# Patient Record
Sex: Female | Born: 1955
Health system: Southern US, Community
[De-identification: ages and names within clinical notes are randomized; demographics above are authoritative.]

## PROBLEM LIST (undated history)

## (undated) HISTORY — PX: LAPAROSCOPY: SHX197

## (undated) HISTORY — PX: BARTHOLIN GLAND CYST EXCISION: SHX565

## (undated) HISTORY — PX: DILATION AND CURETTAGE OF UTERUS: SHX78

---

## 2003-12-21 ENCOUNTER — Ambulatory Visit: Payer: Self-pay | Admitting: Unknown Physician Specialty

## 2004-08-07 ENCOUNTER — Ambulatory Visit: Payer: Self-pay | Admitting: Unknown Physician Specialty

## 2007-04-17 ENCOUNTER — Ambulatory Visit: Payer: Self-pay | Admitting: Obstetrics and Gynecology

## 2011-02-16 ENCOUNTER — Ambulatory Visit: Payer: Self-pay | Admitting: Family Medicine

## 2012-07-04 ENCOUNTER — Ambulatory Visit: Payer: Self-pay | Admitting: Family Medicine

## 2012-09-19 LAB — TSH: TSH: 2.98 u[IU]/mL (ref 0.41–5.90)

## 2012-09-19 LAB — LIPID PANEL
Cholesterol: 198 mg/dL (ref 0–200)
HDL: 79 mg/dL — AB (ref 35–70)
LDL CALC: 103 mg/dL
LDl/HDL Ratio: 1.3
Triglycerides: 140 mg/dL (ref 40–160)

## 2012-09-19 LAB — BASIC METABOLIC PANEL
BUN: 15 mg/dL (ref 4–21)
CREATININE: 0.9 mg/dL (ref 0.5–1.1)
Glucose: 88 mg/dL
Potassium: 4.5 mmol/L (ref 3.4–5.3)
Sodium: 140 mmol/L (ref 137–147)

## 2012-09-19 LAB — CBC AND DIFFERENTIAL
HCT: 43 % (ref 36–46)
Hemoglobin: 14.9 g/dL (ref 12.0–16.0)
Neutrophils Absolute: 2 /uL
PLATELETS: 256 10*3/uL (ref 150–399)
WBC: 6.2 10*3/mL

## 2012-09-19 LAB — HEPATIC FUNCTION PANEL
ALK PHOS: 79 U/L (ref 25–125)
ALT: 18 U/L (ref 7–35)
AST: 20 U/L (ref 13–35)

## 2014-09-14 DIAGNOSIS — D259 Leiomyoma of uterus, unspecified: Secondary | ICD-10-CM | POA: Insufficient documentation

## 2014-09-14 DIAGNOSIS — N809 Endometriosis, unspecified: Secondary | ICD-10-CM | POA: Insufficient documentation

## 2014-09-14 DIAGNOSIS — I10 Essential (primary) hypertension: Secondary | ICD-10-CM | POA: Insufficient documentation

## 2014-09-14 DIAGNOSIS — E039 Hypothyroidism, unspecified: Secondary | ICD-10-CM | POA: Insufficient documentation

## 2014-09-14 DIAGNOSIS — G43909 Migraine, unspecified, not intractable, without status migrainosus: Secondary | ICD-10-CM | POA: Insufficient documentation

## 2014-09-14 DIAGNOSIS — I459 Conduction disorder, unspecified: Secondary | ICD-10-CM | POA: Insufficient documentation

## 2014-09-14 DIAGNOSIS — R011 Cardiac murmur, unspecified: Secondary | ICD-10-CM | POA: Insufficient documentation

## 2014-09-15 ENCOUNTER — Ambulatory Visit (INDEPENDENT_AMBULATORY_CARE_PROVIDER_SITE_OTHER): Payer: BLUE CROSS/BLUE SHIELD | Admitting: Family Medicine

## 2014-09-15 ENCOUNTER — Encounter: Payer: Self-pay | Admitting: Family Medicine

## 2014-09-15 DIAGNOSIS — Z0000 Encounter for general adult medical examination without abnormal findings: Secondary | ICD-10-CM | POA: Diagnosis not present

## 2014-09-15 DIAGNOSIS — R002 Palpitations: Secondary | ICD-10-CM | POA: Diagnosis not present

## 2014-09-15 DIAGNOSIS — E038 Other specified hypothyroidism: Secondary | ICD-10-CM

## 2014-09-15 DIAGNOSIS — Z1211 Encounter for screening for malignant neoplasm of colon: Secondary | ICD-10-CM

## 2014-09-15 MED ORDER — SYNTHROID 50 MCG PO TABS: 50.0000 ug | ORAL_TABLET | Freq: Every day | ORAL | Status: DC

## 2014-09-15 NOTE — Progress Notes (Signed)
Patient ID: Marissa Mccarthy, female   DOB: May 11, 1955, 59 y.o.   MRN: [id number] Visit Date: 09/15/2014  Today's Provider: Wilhemena Durie, MD   Chief Complaint  Patient presents with  . Annual Exam   Subjective:  Marissa Mccarthy is a 59 y.o. female who presents today for health maintenance and complete physical. She feels fairly well. She reports exercising x 2 times a week-walking and lifting weights. She reports she is sleeping fairly well-sometimes, stress with her son right now but states he will moving out soon.  LAST: EKG-09/17/12  Colonoscopy-08/07/04 internal hemorrhoids-repeat 5 years.  Routine labs-09/17/12  Pap smear- 10/2013 in [place] and she has appt scheduled for this  Mammogram-10/2013-she has appointment scheduled for this  Review of Systems  Constitutional: Negative.   HENT: Positive for sneezing.   Eyes: Negative.   Respiratory: Negative.   Cardiovascular: Positive for palpitations. Negative for chest pain and leg swelling.  Gastrointestinal: Negative.   Endocrine: Positive for heat intolerance.  Genitourinary: Negative.   Musculoskeletal: Negative.   Skin: Negative.   Allergic/Immunologic: Positive for environmental allergies.  Neurological: Negative.   Hematological: Negative.   Psychiatric/Behavioral: Positive for sleep disturbance.    Social History   Social History  . Marital Status: Married    Spouse Name: N/A  . Number of Children: N/A  . Years of Education: N/A   Occupational History  . Not on file.   Social History Main Topics  . Smoking status: Never Smoker   . Smokeless tobacco: Never Used  . Alcohol Use: Yes     Comment: 1 drink daily  . Drug Use: No  . Sexual Activity: Not on file   Other Topics Concern  . Not on file   Social History Narrative    Patient Active Problem List   Diagnosis Date Noted  . Cardiac conduction disorder 09/14/2014  . Endometriosis 09/14/2014  . Essential (primary) hypertension 09/14/2014   . Cardiac murmur 09/14/2014  . Adult hypothyroidism 09/14/2014  . Headache, migraine 09/14/2014  . Leiomyoma of uterus 09/14/2014    Past Surgical History  Procedure Laterality Date  . Bartholin gland cyst excision    . Dilation and curettage of uterus    . Laparoscopy      re infertility    Her family history includes Cancer in her mother; Deep vein thrombosis in her sister; Heart attack in her father; Hyperlipidemia in her mother; Hypertension in her father and sister; Irritable bowel syndrome in her mother; Osteoporosis in her mother.    Outpatient Prescriptions Prior to Visit  Medication Sig Dispense Refill  . levothyroxine (SYNTHROID) 50 MCG tablet Take by mouth.     No facility-administered medications prior to visit.    Patient Care Team: Jerrol Banana., MD as PCP - General (Family Medicine)     Objective:   Vitals: There were no vitals filed for this visit.  Physical Exam  Constitutional: She is oriented to person, place, and time. She appears well-developed and well-nourished.  HENT:  Head: Normocephalic and atraumatic.  Right Ear: External ear normal.  Left Ear: External ear normal.  Nose: Nose normal.  Mouth/Throat: Oropharynx is clear and moist.  Eyes: Conjunctivae are normal.  Neck: Neck supple.  Cardiovascular: Normal rate, regular rhythm, normal heart sounds and intact distal pulses.   Pulmonary/Chest: Effort normal and breath sounds normal.  Abdominal: Soft. Bowel sounds are normal.  Neurological: She is alert and oriented to person, place, and time.  Skin: Skin is warm and dry.  Psychiatric: She has a normal mood and affect. Her behavior is normal. Judgment and thought content normal.     Depression Screen PHQ 2/9 Scores 09/15/2014  PHQ - 2 Score 0      Assessment & Plan:    1. Palpitation  - EKG 12-Lead - Ambulatory referral to Cardiology  2. Annual physical exam  - CBC with Differential/Platelet - Lipid Panel With LDL/HDL  Ratio - Comprehensive Metabolic Panel (CMET)  3. Other specified hypothyroidism  - TSH - SYNTHROID 50 MCG tablet; Take 1 tablet (50 mcg total) by mouth daily.  Dispense: 30 tablet; Refill: 12  4. Colon cancer screening  - Ambulatory referral to Gastroenterology  I have done the exam and reviewed the above chart and it is accurate to the best of my knowledge.

## 2014-09-21 NOTE — Addendum Note (Signed)
Addended by: Miguel Aschoff on: 09/21/2014 07:24 PM   Modules accepted: Miquel Dunn

## 2014-09-24 LAB — CBC WITH DIFFERENTIAL/PLATELET
BASOS: 1 %
Basophils Absolute: 0.1 10*3/uL (ref 0.0–0.2)
EOS (ABSOLUTE): 0.3 10*3/uL (ref 0.0–0.4)
EOS: 4 %
HEMATOCRIT: 42 % (ref 34.0–46.6)
Hemoglobin: 14 g/dL (ref 11.1–15.9)
Immature Grans (Abs): 0 10*3/uL (ref 0.0–0.1)
Immature Granulocytes: 0 %
LYMPHS ABS: 3.2 10*3/uL — AB (ref 0.7–3.1)
Lymphs: 48 %
MCH: 29.9 pg (ref 26.6–33.0)
MCHC: 33.3 g/dL (ref 31.5–35.7)
MCV: 90 fL (ref 79–97)
MONOS ABS: 0.5 10*3/uL (ref 0.1–0.9)
Monocytes: 8 %
Neutrophils Absolute: 2.5 10*3/uL (ref 1.4–7.0)
Neutrophils: 39 %
Platelets: 258 10*3/uL (ref 150–379)
RBC: 4.69 x10E6/uL (ref 3.77–5.28)
RDW: 13.8 % (ref 12.3–15.4)
WBC: 6.6 10*3/uL (ref 3.4–10.8)

## 2014-09-24 LAB — COMPREHENSIVE METABOLIC PANEL
A/G RATIO: 1.8 (ref 1.1–2.5)
ALK PHOS: 78 IU/L (ref 39–117)
ALT: 25 IU/L (ref 0–32)
AST: 26 IU/L (ref 0–40)
Albumin: 4.6 g/dL (ref 3.5–5.5)
BUN/Creatinine Ratio: 15 (ref 9–23)
BUN: 13 mg/dL (ref 6–24)
Bilirubin Total: 0.6 mg/dL (ref 0.0–1.2)
CO2: 21 mmol/L (ref 18–29)
Calcium: 10.1 mg/dL (ref 8.7–10.2)
Chloride: 104 mmol/L (ref 97–108)
Creatinine, Ser: 0.84 mg/dL (ref 0.57–1.00)
GFR calc Af Amer: 89 mL/min/{1.73_m2} (ref >59)
GFR calc non Af Amer: 77 mL/min/{1.73_m2} (ref >59)
GLOBULIN, TOTAL: 2.6 g/dL (ref 1.5–4.5)
Glucose: 85 mg/dL (ref 65–99)
POTASSIUM: 4.3 mmol/L (ref 3.5–5.2)
SODIUM: 143 mmol/L (ref 134–144)
Total Protein: 7.2 g/dL (ref 6.0–8.5)

## 2014-09-24 LAB — LIPID PANEL WITH LDL/HDL RATIO
Cholesterol, Total: 205 mg/dL — ABNORMAL HIGH (ref 100–199)
HDL: 71 mg/dL (ref >39)
LDL Calculated: 111 mg/dL — ABNORMAL HIGH (ref 0–99)
LDL/HDL RATIO: 1.6 ratio (ref 0.0–3.2)
Triglycerides: 117 mg/dL (ref 0–149)
VLDL Cholesterol Cal: 23 mg/dL (ref 5–40)

## 2014-09-24 LAB — TSH: TSH: 5.47 u[IU]/mL — AB (ref 0.450–4.500)

## 2014-09-24 MED ORDER — SYNTHROID 75 MCG PO TABS: 75.0000 ug | ORAL_TABLET | Freq: Every day | ORAL | Status: DC

## 2014-09-24 NOTE — Addendum Note (Signed)
Addended by: Arnette Norris on: 09/24/2014 11:43 AM   Modules accepted: Orders, Medications

## 2014-10-02 ENCOUNTER — Other Ambulatory Visit: Payer: Self-pay | Admitting: Family Medicine

## 2014-11-08 ENCOUNTER — Other Ambulatory Visit: Payer: Self-pay | Admitting: Nurse Practitioner

## 2014-11-08 DIAGNOSIS — R1031 Right lower quadrant pain: Secondary | ICD-10-CM

## 2014-11-16 ENCOUNTER — Ambulatory Visit
Admission: RE | Admit: 2014-11-16 | Discharge: 2014-11-16 | Disposition: A | Discharge status: Home / Self Care | Payer: BLUE CROSS/BLUE SHIELD | Source: Ambulatory Visit | Attending: Nurse Practitioner | Admitting: Nurse Practitioner

## 2014-11-16 DIAGNOSIS — R1031 Right lower quadrant pain: Secondary | ICD-10-CM | POA: Insufficient documentation

## 2014-11-16 MED ORDER — IOHEXOL 300 MG/ML  SOLN
100.0000 mL | Freq: Once | INTRAMUSCULAR | Status: AC | PRN
  Administered 2014-11-16: 100 mL via INTRAVENOUS

## 2015-01-03 ENCOUNTER — Encounter: Payer: Self-pay | Admitting: Family Medicine

## 2015-05-25 ENCOUNTER — Other Ambulatory Visit: Payer: Self-pay | Admitting: Obstetrics and Gynecology

## 2015-05-25 DIAGNOSIS — N8500 Endometrial hyperplasia, unspecified: Secondary | ICD-10-CM

## 2015-05-26 ENCOUNTER — Ambulatory Visit (INDEPENDENT_AMBULATORY_CARE_PROVIDER_SITE_OTHER): Payer: BLUE CROSS/BLUE SHIELD

## 2015-05-26 DIAGNOSIS — N8500 Endometrial hyperplasia, unspecified: Secondary | ICD-10-CM | POA: Diagnosis not present

## 2015-10-25 ENCOUNTER — Other Ambulatory Visit: Payer: Self-pay | Admitting: Family Medicine

## 2016-03-10 IMAGING — CT CT ABD-PELV W/ CM
2 of 5 series · 16 of 46 positions shown, 18 images · IV contrast (omnipaque)
Comparison: Report from 12/21/2003 CT abdomen/ pelvis (images not
available).

CLINICAL DATA: Right lower quadrant abdominal pain for several
months. Endometriosis.

EXAM:
CT ABDOMEN AND PELVIS WITH CONTRAST
TECHNIQUE: Multidetector CT imaging of the abdomen and pelvis was performed
using the standard protocol following bolus administration of
intravenous contrast.
CONTRAST:  100mL OMNIPAQUE IOHEXOL 300 MG/ML  SOLN

[Series 2: routine with · axial · 0.65mm/px · z∈[-1180,-810]mm · 13 of 84 slices shown, 15 images]
[im 5/84  soft-tissue]
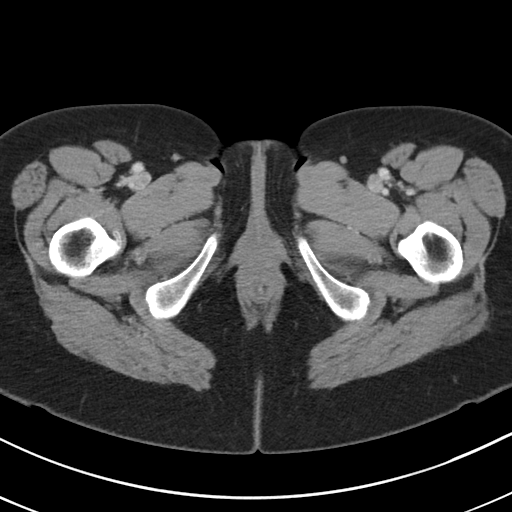
[im 5/84  bone]
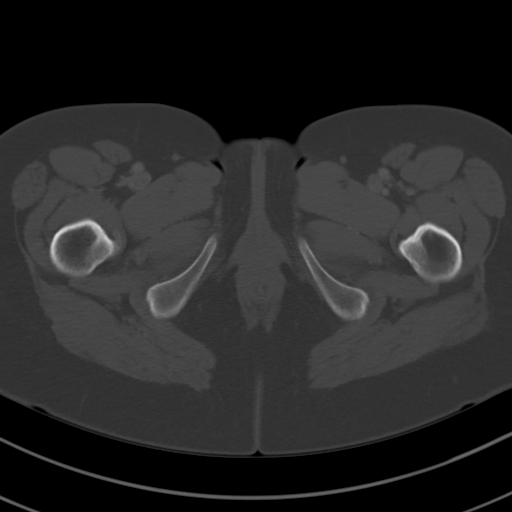
[im 10/84  soft-tissue]
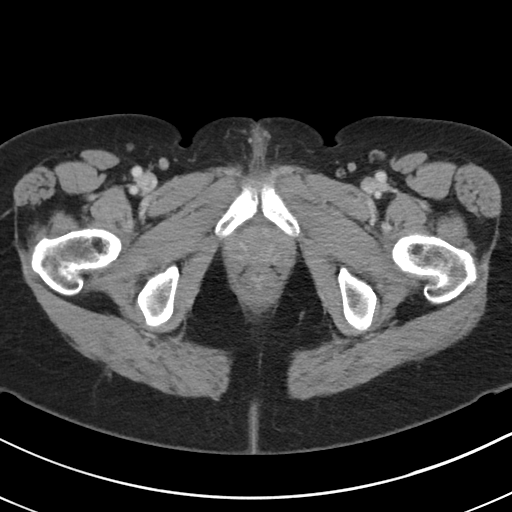
[im 19/84  soft-tissue]
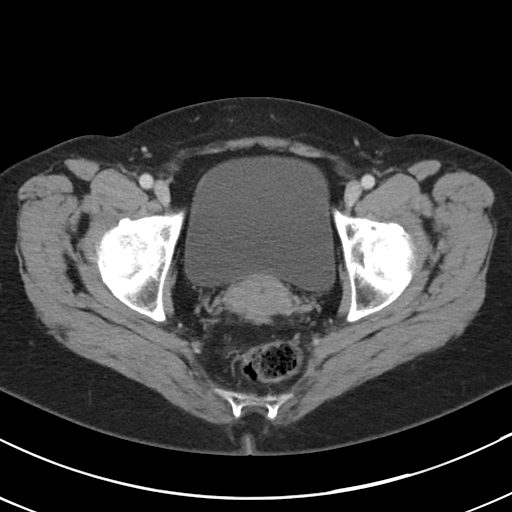
[im 24/84  soft-tissue]
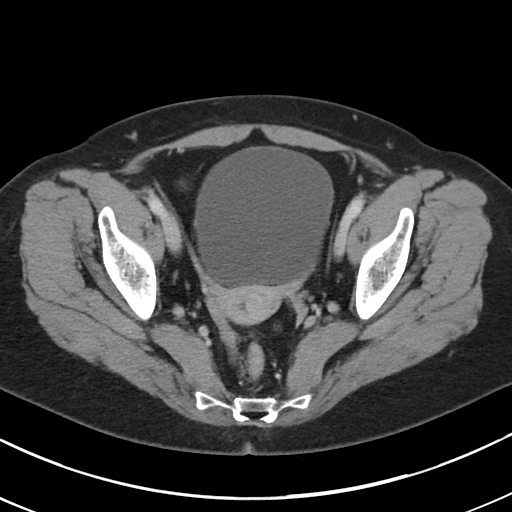
[im 28/84  soft-tissue]
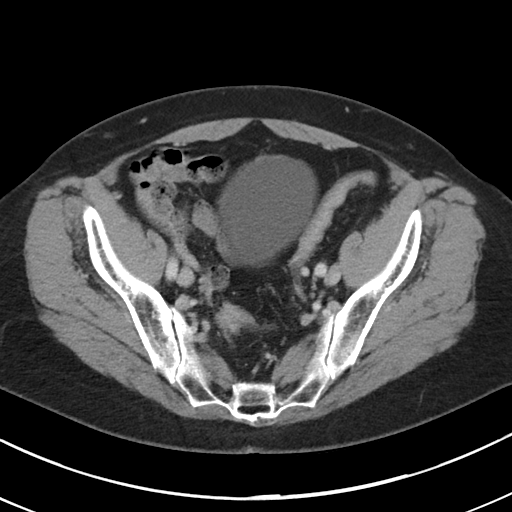
[im 37/84  soft-tissue]
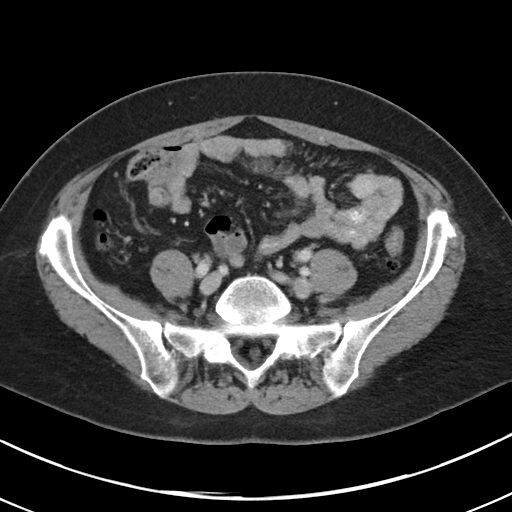
[im 42/84  soft-tissue]
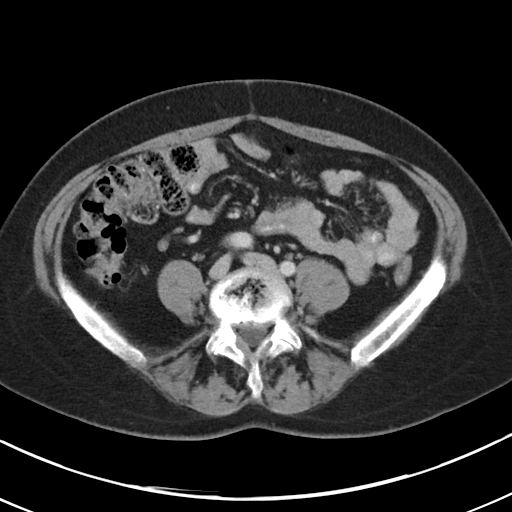
[im 47/84  soft-tissue]
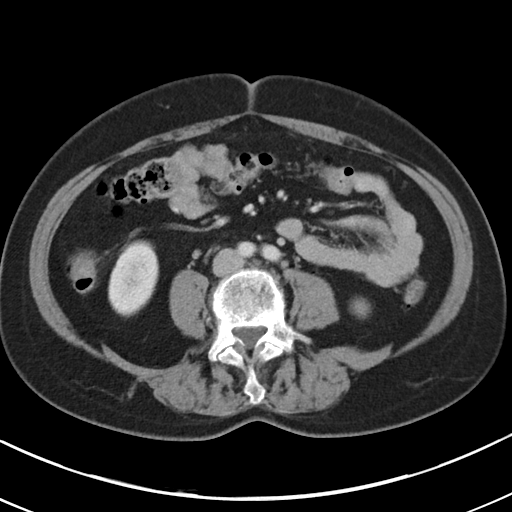
[im 56/84  soft-tissue]
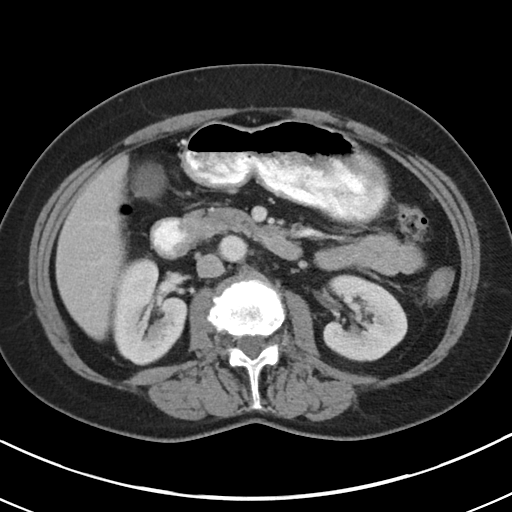
[im 56/84  bone]
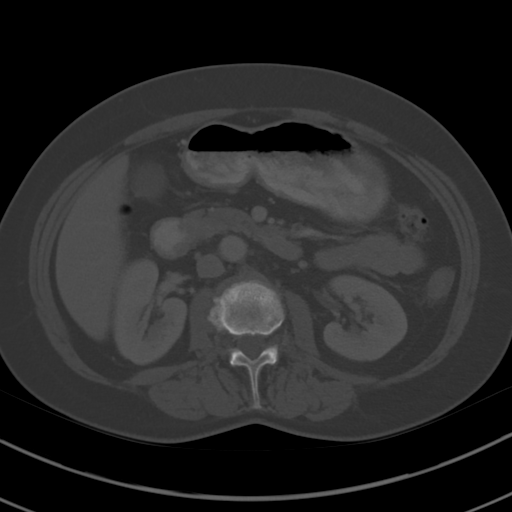
[im 60/84  soft-tissue]
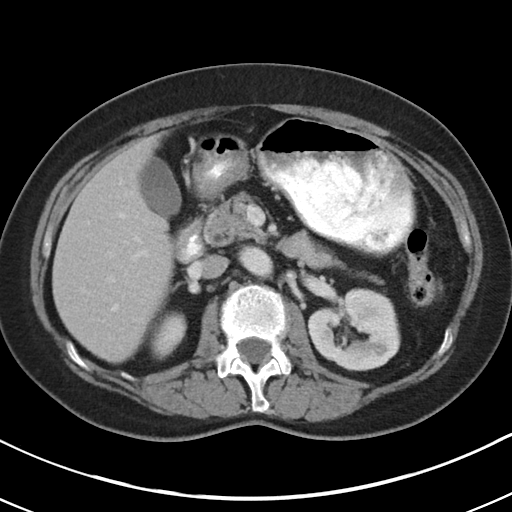
[im 65/84  soft-tissue]
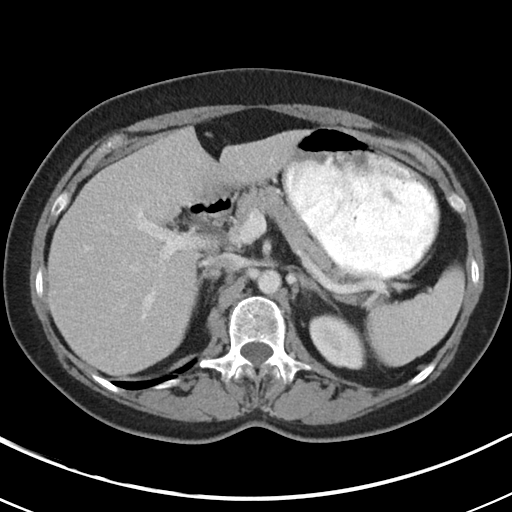
[im 74/84  soft-tissue]
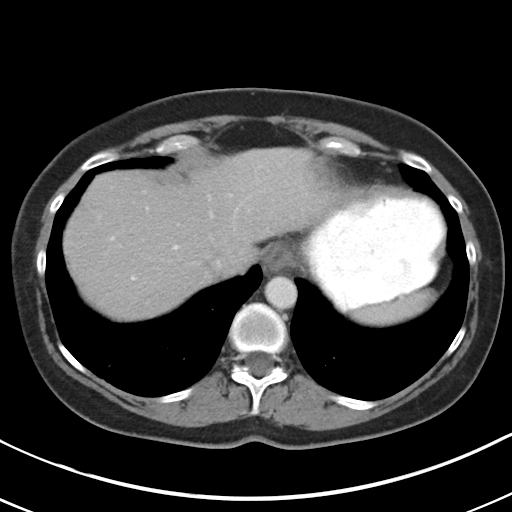
[im 79/84  soft-tissue]
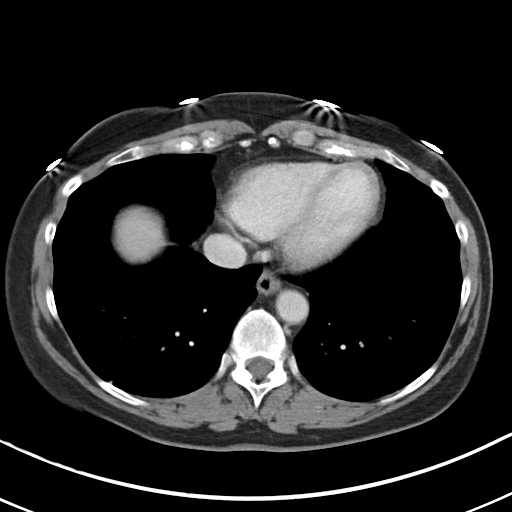

[Series 6: cor routine with · coronal · 0.64mm/px · 3 of 133 slices shown]
[im 45/133  soft-tissue]
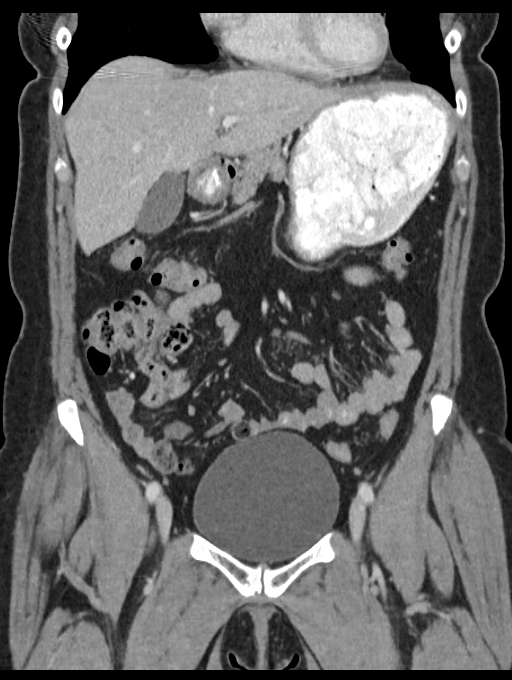
[im 59/133  soft-tissue]
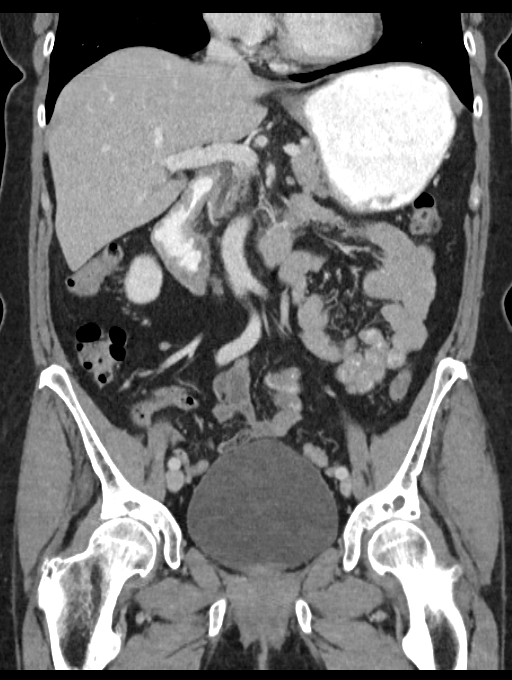
[im 74/133  soft-tissue]
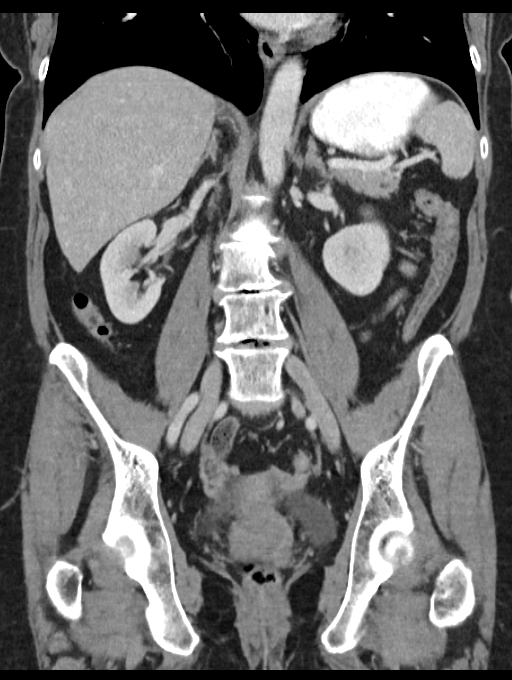

[16 of 46 positions shown; findings below may reference images not displayed]

FINDINGS: Lower chest: Minimal subsegmental scarring in the basilar posterior
right lower lobe.

Hepatobiliary: Normal liver with no liver mass. Normal gallbladder
with no radiopaque cholelithiasis. No biliary ductal dilatation.

Pancreas: Normal, with no mass or duct dilation.

Spleen: Normal size. No mass.

Adrenals/Urinary Tract: Normal adrenals. No hydronephrosis.
Subcentimeter hypodense lesion in the posterior mid to upper left
kidney, too small to characterize. No additional renal lesions.
Normal bladder.

Stomach/Bowel: Grossly normal stomach. Normal caliber small bowel
with no small bowel wall thickening. Normal appendix. Normal large
bowel with no diverticulosis, large bowel wall thickening or
pericolonic fat stranding.

Vascular/Lymphatic: Atherosclerotic nonaneurysmal abdominal aorta.
Patent portal, splenic, hepatic and renal veins. No pathologically
enlarged lymph nodes in the abdomen or pelvis.

Reproductive: There is a 1.1 cm round mass in the central uterus
(series 2/ image 61). No adnexal mass.

Other: No pneumoperitoneum, ascites or focal fluid collection.

Musculoskeletal: No aggressive appearing focal osseous lesions.
Moderate to severe degenerative disc disease in the lumbar spine.
IMPRESSION: 1. No evidence of bowel obstruction or acute bowel inflammation.
Normal appendix.
2. Round 1.1 cm mass in the central uterus, which is nonspecific and
could represent a submucosal/intracavitary fibroid or endometrial
mass. Recommend correlation with transabdominal and transvaginal
pelvic sonography.

## 2016-03-20 ENCOUNTER — Ambulatory Visit: Payer: Self-pay | Admitting: Family Medicine

## 2016-04-03 ENCOUNTER — Ambulatory Visit: Payer: Self-pay | Admitting: Family Medicine

## 2016-04-11 ENCOUNTER — Ambulatory Visit (INDEPENDENT_AMBULATORY_CARE_PROVIDER_SITE_OTHER): Payer: BLUE CROSS/BLUE SHIELD | Admitting: Family Medicine

## 2016-04-11 ENCOUNTER — Encounter: Payer: Self-pay | Admitting: Family Medicine

## 2016-04-11 VITALS — BP 132/78 | HR 72 | Resp 14 | Ht 65.0 in | Wt 168.0 lb

## 2016-04-11 DIAGNOSIS — E784 Other hyperlipidemia: Secondary | ICD-10-CM

## 2016-04-11 DIAGNOSIS — Z6827 Body mass index (BMI) 27.0-27.9, adult: Secondary | ICD-10-CM

## 2016-04-11 DIAGNOSIS — E038 Other specified hypothyroidism: Secondary | ICD-10-CM

## 2016-04-11 DIAGNOSIS — E7849 Other hyperlipidemia: Secondary | ICD-10-CM

## 2016-04-11 NOTE — Progress Notes (Signed)
Marissa Mccarthy  MRN: [id number] DOB: 1955-04-18  Subjective:  HPI  Patient is here for follow up. LOV was 09/15/14 for CPE. Routine labs were done at that time. She is taking Synthroid-brand name. She is not taking it on empty stomach and how to go about when the best time to take it. Lab Results  Component Value Date   TSH 5.470 (H) 09/23/2014    Patient Active Problem List   Diagnosis Date Noted  . Cardiac conduction disorder 09/14/2014  . Endometriosis 09/14/2014  . Essential (primary) hypertension 09/14/2014  . Cardiac murmur 09/14/2014  . Adult hypothyroidism 09/14/2014  . Headache, migraine 09/14/2014  . Leiomyoma of uterus 09/14/2014    History reviewed. No pertinent past medical history.  Social History   Social History  . Marital status: Married    Spouse name: N/A  . Number of children: N/A  . Years of education: N/A   Occupational History  . Not on file.   Social History Main Topics  . Smoking status: Never Smoker  . Smokeless tobacco: Never Used  . Alcohol use Yes     Comment: 1 drink daily  . Drug use: No  . Sexual activity: Not on file   Other Topics Concern  . Not on file   Social History Narrative  . No narrative on file    Outpatient Encounter Prescriptions as of 04/11/2016  Medication Sig  . SYNTHROID 75 MCG tablet TAKE 1 TABLET (75 MCG TOTAL) BY MOUTH DAILY BEFORE BREAKFAST.   No facility-administered encounter medications on file as of 04/11/2016.     No Known Allergies  Review of Systems  Constitutional: Negative.   Respiratory: Negative.   Cardiovascular: Negative.   Gastrointestinal: Negative.   Musculoskeletal: Negative.     Objective:  BP 132/78   Pulse 72   Resp 14   Ht 5\' 5"  (1.651 m)   Wt 168 lb (76.2 kg)   BMI 27.96 kg/m   Physical Exam  Constitutional: She is oriented to person, place, and time and well-developed, well-nourished, and in no distress.  HENT:  Head: Normocephalic and atraumatic.  Eyes:  Conjunctivae are normal. Pupils are equal, round, and reactive to light.  Neck: Normal range of motion. Neck supple.  Cardiovascular: Normal rate, regular rhythm, normal heart sounds and intact distal pulses.   No murmur heard. Pulmonary/Chest: Effort normal and breath sounds normal. No respiratory distress. She has no wheezes.  Abdominal: Soft. She exhibits no distension. There is no tenderness. There is no rebound.  Musculoskeletal: She exhibits no edema or tenderness.  Neurological: She is alert and oriented to person, place, and time.  Psychiatric: Mood, memory, affect and judgment normal.   Assessment and Plan :  1. Other specified hypothyroidism Labs ordered today. Discussed with patient how to take Synthroid but due to her daily routine in her life advised patient to check with pharmacist what is the best way to take the medication for her routine. - CBC w/Diff/Platelet - TSH Mammogram is scheduled in April 2018 and advised patient to get records to [place] on this.  2. BMI 27.0-27.9,adult - CBC w/Diff/Platelet - Comprehensive metabolic panel  3. Other hyperlipidemia - CBC w/Diff/Platelet - Comprehensive metabolic panel - Lipid Panel With LDL/HDL Ratio 4.Health Maintenance Pt UTD on Gyn exam issues. Discussed medical CPE in next year. HPI, Exam and A&P transcribed under direction and in the presence of Miguel Aschoff, MD. I have done the exam and reviewed the chart and it is accurate to  the best of my knowledge. Development worker, community has been used and  any errors in dictation or transcription are unintentional. Miguel Aschoff M.D. [facility]

## 2016-05-04 LAB — HM PAP SMEAR

## 2016-05-07 LAB — HM MAMMOGRAPHY

## 2016-12-10 ENCOUNTER — Other Ambulatory Visit: Payer: Self-pay | Admitting: Family Medicine

## 2017-04-22 ENCOUNTER — Telehealth: Payer: Self-pay | Admitting: Family Medicine

## 2017-04-22 DIAGNOSIS — S62109S Fracture of unspecified carpal bone, unspecified wrist, sequela: Secondary | ICD-10-CM

## 2017-04-22 NOTE — Telephone Encounter (Signed)
thx

## 2017-04-22 NOTE — Telephone Encounter (Signed)
Pt called wanting a referral to [facility] for a broken wrist.    Dr Levell July number is [phone]  She thinks Dr. Enzo Bi talked to you regarding this today.  Pt's call back is [phone]  Thanks Con Memos

## 2017-04-22 NOTE — Telephone Encounter (Signed)
Please advise. Thanks.  

## 2017-04-22 NOTE — Telephone Encounter (Signed)
Per Dr. Rosanna Randy. Referral sent.

## 2017-05-09 ENCOUNTER — Other Ambulatory Visit: Payer: Self-pay | Admitting: Family Medicine

## 2017-05-09 DIAGNOSIS — G8929 Other chronic pain: Secondary | ICD-10-CM

## 2017-05-09 DIAGNOSIS — M545 Low back pain: Principal | ICD-10-CM

## 2017-06-10 ENCOUNTER — Encounter: Payer: Self-pay | Admitting: Family Medicine

## 2017-06-10 ENCOUNTER — Ambulatory Visit (INDEPENDENT_AMBULATORY_CARE_PROVIDER_SITE_OTHER): Payer: BLUE CROSS/BLUE SHIELD | Admitting: Family Medicine

## 2017-06-10 VITALS — BP 132/84 | HR 68 | Temp 98.8°F | Resp 14 | Wt 164.0 lb

## 2017-06-10 DIAGNOSIS — M1611 Unilateral primary osteoarthritis, right hip: Secondary | ICD-10-CM

## 2017-06-10 DIAGNOSIS — G43109 Migraine with aura, not intractable, without status migrainosus: Secondary | ICD-10-CM | POA: Diagnosis not present

## 2017-06-10 DIAGNOSIS — I10 Essential (primary) hypertension: Secondary | ICD-10-CM | POA: Diagnosis not present

## 2017-06-10 DIAGNOSIS — S63501S Unspecified sprain of right wrist, sequela: Secondary | ICD-10-CM

## 2017-06-10 DIAGNOSIS — E039 Hypothyroidism, unspecified: Secondary | ICD-10-CM

## 2017-06-10 DIAGNOSIS — E7849 Other hyperlipidemia: Secondary | ICD-10-CM

## 2017-06-10 DIAGNOSIS — F4323 Adjustment disorder with mixed anxiety and depressed mood: Secondary | ICD-10-CM

## 2017-06-10 MED ORDER — TELMISARTAN 20 MG PO TABS: 20.0000 mg | ORAL_TABLET | Freq: Every day | ORAL | 3 refills | Status: DC

## 2017-06-10 NOTE — Patient Instructions (Signed)
Counselor Lady Deutscher

## 2017-06-10 NOTE — Progress Notes (Signed)
Patient: Marissa Mccarthy Female    DOB: August 14, 1955   62 y.o.   MRN: [id number] Visit Date: 06/10/2017  Today's Provider: Wilhemena Durie, MD   Chief Complaint  Patient presents with  . Consult    discuss BP  . Anxiety   Subjective:    HPI Pt is here today to discuss her BP and her anxiety. She reports that she had an optic migraine last week after a job interview and she has been under a lot more stress than normal. She reports that she used to be on something for her BP but with exercise she was able to get off of it but since her fall she has not been able to exercise and she feels like her BP has been elevated. She also thinks she may need to get back into counseling.  She is under a lot of stress and is worried about TIA/CVA risk. She and her husband are looking to move to the [place] area of [place].She is tearful and feels overwhelmed.   GAD 7 : Generalized Anxiety Score 06/10/2017  Nervous, Anxious, on Edge 2  Control/stop worrying 1  Worry too much - different things 2  Trouble relaxing 1  Restless 0  Easily annoyed or irritable 1  Afraid - awful might happen 0  Total GAD 7 Score 7  Anxiety Difficulty Somewhat difficult       No Known Allergies   Current Outpatient Medications:  .  SYNTHROID 75 MCG tablet, TAKE 1 TABLET (75 MCG TOTAL) BY MOUTH DAILY BEFORE BREAKFAST., Disp: 90 tablet, Rfl: 3 .  meloxicam (MOBIC) 7.5 MG tablet, , Disp: , Rfl: 1  Review of Systems  Constitutional: Negative.   HENT: Negative.   Eyes: Negative.   Respiratory: Negative.   Cardiovascular: Negative.   Gastrointestinal: Negative.   Endocrine: Negative.   Genitourinary: Negative.   Musculoskeletal: Positive for arthralgias.  Skin: Negative.   Allergic/Immunologic: Negative.   Neurological: Negative.        When she has coular migraines she has visual disturbanc e and once had speech disturbance.  Hematological: Negative.   Psychiatric/Behavioral: The patient is  nervous/anxious.     Social History   Tobacco Use  . Smoking status: Never Smoker  . Smokeless tobacco: Never Used  Substance Use Topics  . Alcohol use: Yes    Comment: 1 drink daily   Objective:   BP 132/84 (BP Location: Left Arm, Patient Position: Sitting, Cuff Size: Normal)   Pulse 68   Temp 98.8 F (37.1 C) (Oral)   Resp 14   Wt 164 lb (74.4 kg)   BMI 27.29 kg/m  Vitals:   06/10/17 0858  BP: 132/84  Pulse: 68  Resp: 14  Temp: 98.8 F (37.1 C)  TempSrc: Oral  Weight: 164 lb (74.4 kg)     Physical Exam  Constitutional: She is oriented to person, place, and time. She appears well-developed and well-nourished.  HENT:  Head: Normocephalic and atraumatic.  Right Ear: External ear normal.  Left Ear: External ear normal.  Nose: Nose normal.  Mouth/Throat: Oropharynx is clear and moist.  Eyes: Pupils are equal, round, and reactive to light. Conjunctivae and EOM are normal. No scleral icterus.  Cardiovascular: Normal rate, regular rhythm, normal heart sounds and intact distal pulses.  Pulmonary/Chest: Effort normal and breath sounds normal.  Abdominal: Soft.  Lymphadenopathy:    She has no cervical adenopathy.  Neurological: She is alert and oriented to person, place, and  time. No cranial nerve deficit or sensory deficit. She exhibits normal muscle tone. Coordination normal.  Skin: Skin is warm and dry.  Psychiatric: She has a normal mood and affect. Her behavior is normal. Judgment and thought content normal.  She is crying at times during visit.        Assessment & Plan:     1. Essential (primary) hypertension  - telmisartan (MICARDIS) 20 MG tablet; Take 1 tablet (20 mg total) by mouth daily.  Dispense: 90 tablet; Refill: 3 - CBC with Differential/Platelet  2. Adult hypothyroidism  - TSH  3. Other hyperlipidemia  - Lipid panel - Comprehensive metabolic panel 4.Ocular Migraine vs TIA  TIA less likely. Discussed possible referral to neurology but she  declines presently.  5.Adjustment Reaction  Pt declines meds but we agree she would benefit from counseling. Refer to Lady Deutscher or Cooper Render or [employer].  I have done the exam and reviewed the chart and it is accurate to the best of my knowledge. Development worker, community has been used and  any errors in dictation or transcription are unintentional. Miguel Aschoff M.D. [place], MD  [facility]

## 2017-07-12 LAB — CBC WITH DIFFERENTIAL/PLATELET
BASOS ABS: 0.1 10*3/uL (ref 0.0–0.2)
Basos: 1 %
EOS (ABSOLUTE): 0.3 10*3/uL (ref 0.0–0.4)
Eos: 6 %
HEMOGLOBIN: 13.8 g/dL (ref 11.1–15.9)
Hematocrit: 40.8 % (ref 34.0–46.6)
Immature Grans (Abs): 0 10*3/uL (ref 0.0–0.1)
Immature Granulocytes: 0 %
LYMPHS ABS: 2.7 10*3/uL (ref 0.7–3.1)
Lymphs: 45 %
MCH: 31 pg (ref 26.6–33.0)
MCHC: 33.8 g/dL (ref 31.5–35.7)
MCV: 92 fL (ref 79–97)
MONOS ABS: 0.5 10*3/uL (ref 0.1–0.9)
Monocytes: 8 %
NEUTROS ABS: 2.4 10*3/uL (ref 1.4–7.0)
Neutrophils: 40 %
Platelets: 240 10*3/uL (ref 150–450)
RBC: 4.45 x10E6/uL (ref 3.77–5.28)
RDW: 13.8 % (ref 12.3–15.4)
WBC: 5.9 10*3/uL (ref 3.4–10.8)

## 2017-07-12 LAB — COMPREHENSIVE METABOLIC PANEL
A/G RATIO: 2 (ref 1.2–2.2)
ALK PHOS: 77 IU/L (ref 39–117)
ALT: 19 IU/L (ref 0–32)
AST: 17 IU/L (ref 0–40)
Albumin: 4.7 g/dL (ref 3.6–4.8)
BILIRUBIN TOTAL: 0.6 mg/dL (ref 0.0–1.2)
BUN/Creatinine Ratio: 18 (ref 12–28)
BUN: 17 mg/dL (ref 8–27)
CHLORIDE: 107 mmol/L — AB (ref 96–106)
CO2: 19 mmol/L — ABNORMAL LOW (ref 20–29)
Calcium: 9.8 mg/dL (ref 8.7–10.3)
Creatinine, Ser: 0.95 mg/dL (ref 0.57–1.00)
GFR calc non Af Amer: 65 mL/min/{1.73_m2} (ref >59)
GFR, EST AFRICAN AMERICAN: 75 mL/min/{1.73_m2} (ref >59)
Globulin, Total: 2.4 g/dL (ref 1.5–4.5)
Glucose: 88 mg/dL (ref 65–99)
POTASSIUM: 4.2 mmol/L (ref 3.5–5.2)
Sodium: 141 mmol/L (ref 134–144)
TOTAL PROTEIN: 7.1 g/dL (ref 6.0–8.5)

## 2017-07-12 LAB — LIPID PANEL
CHOL/HDL RATIO: 3.2 ratio (ref 0.0–4.4)
Cholesterol, Total: 204 mg/dL — ABNORMAL HIGH (ref 100–199)
HDL: 64 mg/dL (ref >39)
LDL CALC: 113 mg/dL — AB (ref 0–99)
TRIGLYCERIDES: 134 mg/dL (ref 0–149)
VLDL CHOLESTEROL CAL: 27 mg/dL (ref 5–40)

## 2017-07-12 LAB — TSH: TSH: 3.02 u[IU]/mL (ref 0.450–4.500)

## 2017-07-15 ENCOUNTER — Telehealth: Payer: Self-pay

## 2017-07-15 NOTE — Telephone Encounter (Signed)
Left message to call back  

## 2017-07-15 NOTE — Telephone Encounter (Signed)
-----   Message from Jerrol Banana., MD sent at 07/15/2017  8:25 AM EDT ----- Labs ok--continue to work on good lifestyle habits.

## 2017-07-16 ENCOUNTER — Encounter: Payer: Self-pay | Admitting: Family Medicine

## 2017-07-16 ENCOUNTER — Ambulatory Visit (INDEPENDENT_AMBULATORY_CARE_PROVIDER_SITE_OTHER): Payer: BLUE CROSS/BLUE SHIELD | Admitting: Family Medicine

## 2017-07-16 VITALS — BP 118/68 | HR 68 | Temp 98.9°F | Resp 16 | Wt 166.0 lb

## 2017-07-16 DIAGNOSIS — I10 Essential (primary) hypertension: Secondary | ICD-10-CM

## 2017-07-16 DIAGNOSIS — F419 Anxiety disorder, unspecified: Secondary | ICD-10-CM | POA: Diagnosis not present

## 2017-07-16 NOTE — Telephone Encounter (Signed)
Advised patient of results in the office.

## 2017-07-16 NOTE — Progress Notes (Signed)
Patient: Marissa Mccarthy Female    DOB: 08-08-1955   62 y.o.   MRN: [id number] Visit Date: 07/16/2017  Today's Provider: Wilhemena Durie, MD   Chief Complaint  Patient presents with  . Hypertension  . Anxiety   Subjective:    HPI Patient is a 62 year old female who presents today for a follow up. She was last seen in the office 1 month ago. She and her husband are planning to move to the [place]. Hypertension Patient reports that her BP was running higher than normal. It was suggested that her symptoms may be stress related. She was started on telmisartan 20mg  daily. Patient reports that she has tolerated the medication well.   BP Readings from Last 3 Encounters:  07/16/17 118/68  06/10/17 132/84  04/11/16 132/78   Anxiety Patient has been dealing with a lot of stress. She has also had secondary migraines because of this. It was recommended that patient try meds, but she declined. However, she did agree to start counseling.     No Known Allergies   Current Outpatient Medications:  .  meloxicam (MOBIC) 7.5 MG tablet, , Disp: , Rfl: 1 .  telmisartan (MICARDIS) 20 MG tablet, Take 1 tablet (20 mg total) by mouth daily., Disp: 90 tablet, Rfl: 3 .  SYNTHROID 75 MCG tablet, TAKE 1 TABLET (75 MCG TOTAL) BY MOUTH DAILY BEFORE BREAKFAST., Disp: 90 tablet, Rfl: 3  Review of Systems  Constitutional: Positive for fatigue. Negative for activity change, appetite change, chills, diaphoresis, fever and unexpected weight change.  Eyes: Negative.   Respiratory: Negative.   Cardiovascular: Negative for chest pain, palpitations and leg swelling.  Endocrine: Negative.   Musculoskeletal: Positive for arthralgias.  Allergic/Immunologic: Negative.   Neurological: Negative for dizziness, weakness, light-headedness, numbness and headaches.  Hematological: Negative.   Psychiatric/Behavioral: Negative for agitation, self-injury, sleep disturbance and suicidal ideas. The patient  is nervous/anxious.     Social History   Tobacco Use  . Smoking status: Never Smoker  . Smokeless tobacco: Never Used  Substance Use Topics  . Alcohol use: Yes    Comment: 1 drink daily   Objective:   BP 118/68 (BP Location: Right Arm, Patient Position: Sitting, Cuff Size: Normal)   Pulse 68   Temp 98.9 F (37.2 C)   Resp 16   Wt 166 lb (75.3 kg)   SpO2 98%   BMI 27.62 kg/m  Vitals:   07/16/17 0832  BP: 118/68  Pulse: 68  Resp: 16  Temp: 98.9 F (37.2 C)  SpO2: 98%  Weight: 166 lb (75.3 kg)     Physical Exam  Constitutional: She is oriented to person, place, and time. She appears well-developed and well-nourished.  HENT:  Head: Normocephalic and atraumatic.  Eyes: Conjunctivae are normal. No scleral icterus.  Neck: No thyromegaly present.  Cardiovascular: Normal rate, regular rhythm and normal heart sounds.  Pulmonary/Chest: Effort normal and breath sounds normal.  Abdominal: Soft.  Neurological: She is alert and oriented to person, place, and time.  Skin: Skin is warm and dry.  Psychiatric: She has a normal mood and affect. Her behavior is normal. Judgment and thought content normal.        Assessment & Plan:      HTN Controlled. RTC 6 months or prn. Anxiety Pt to seek counseling.     I have done the exam and reviewed the above chart and it is accurate to the best of my knowledge. Diplomatic Services operational officer  technology has been used in this note in any air is in the dictation or transcription are unintentional.  Wilhemena Durie, MD  [place]

## 2017-08-21 ENCOUNTER — Other Ambulatory Visit: Payer: Self-pay

## 2017-08-21 ENCOUNTER — Ambulatory Visit: Payer: BLUE CROSS/BLUE SHIELD | Attending: Orthopedic Surgery | Admitting: Physical Therapy

## 2017-08-21 ENCOUNTER — Encounter: Payer: Self-pay | Admitting: Physical Therapy

## 2017-08-21 DIAGNOSIS — M25551 Pain in right hip: Secondary | ICD-10-CM | POA: Insufficient documentation

## 2017-08-21 DIAGNOSIS — M25651 Stiffness of right hip, not elsewhere classified: Secondary | ICD-10-CM | POA: Diagnosis present

## 2017-08-21 DIAGNOSIS — M6281 Muscle weakness (generalized): Secondary | ICD-10-CM | POA: Diagnosis present

## 2017-08-21 NOTE — Therapy (Signed)
[facility] [address], [place], [place], [address] Phone: [phone]   Fax:  [phone]  Physical Therapy Evaluation  Patient Details  Name: Marissa Mccarthy MRN: [id number] Date of Birth: 21-Jul-1961 Referring Provider: Dr. Thornton Park   Encounter Date: 08/21/2017  PT End of Session - 08/21/17 0954    Visit Number  1    Date for PT Re-Evaluation  10/16/17    Authorization Type  BCBS    PT Start Time  0930    PT Stop Time  1010    PT Time Calculation (min)  40 min    Activity Tolerance  Patient tolerated treatment well    Behavior During Therapy  [facility] for tasks assessed/performed       History reviewed. No pertinent past medical history.  Past Surgical History:  Procedure Laterality Date  . BARTHOLIN GLAND CYST EXCISION    . DILATION AND CURETTAGE OF UTERUS    . LAPAROSCOPY     re infertility    There were no vitals filed for this visit.   Subjective Assessment - 08/21/17 0851    Subjective  [place] On the way down fell on right side and broke right wrist. Patient had a huge black and blue mark.  Walking had pain in right hip and down leg.     How long can you sit comfortably?  sit to drive the right hip will get sore and ache, better with cruise control with right foot on floor    How long can you walk comfortably?  walk 1 mile on treadmill for 3.1 mph    Diagnostic tests  Arthritis and bone spurs in the right hip from x-ray    Patient Stated Goals  work on pain, strength, mobility    Currently in Pain?  Yes    Pain Score  3     Pain Location  Hip    Pain Orientation  Right    Pain Descriptors / Indicators  Aching    Pain Type  Acute pain    Pain Onset  More than a month ago    Pain Frequency  Intermittent    Aggravating Factors   walking, turning right foot outward, crossing legs, , walking in heels, driving, walking up an incline,     Pain Relieving Factors   massages every 2 weeks, meloxicam    Multiple Pain Sites  No         [facility] PT Assessment - 08/21/17 0001      Assessment   Medical Diagnosis  M16.11 Unilateral primary osteoarthritis, right hip    Referring Provider  Dr. Thornton Park    Onset Date/Surgical Date  04/19/17    Prior Therapy  none      Precautions   Precautions  None      Restrictions   Weight Bearing Restrictions  No      Balance Screen   Has the patient fallen in the past 6 months  Yes    How many times?  1 [place]    Has the patient had a decrease in activity level because of a fear of falling?   No    Is the patient reluctant to leave their home because of a fear of falling?   No      Home Film/video editor residence      Prior Function   Level  of Independence  Independent    Vocation  Full time employment    Vocation Requirements  driving    Leisure  yoga, workout with a trainer, massage therapist      Cognition   Overall Cognitive Status  Within Functional Limits for tasks assessed      Observation/Other Assessments   Focus on Therapeutic Outcomes (FOTO)   49% limitation goal 35% limitation      Posture/Postural Control   Posture/Postural Control  No significant limitations      ROM / Strength   AROM / PROM / Strength  AROM;PROM;Strength      AROM   Overall AROM Comments  lumbar ROM is full      PROM   Right Hip External Rotation   55    Right Hip Internal Rotation   2      Strength   Right Hip External Rotation   4/5    Right Hip Internal Rotation  4/5    Right Hip ABduction  3+/5      Right Hip   Right Hip Extension  0    Right Hip Flexion  100    Right Hip ABduction  15      Palpation   SI assessment   ASIS is equal    Palpation comment  tenderness in right psoas, right gluteus medius, right greater trochanter      Special Tests    Special Tests  Hip Special Tests    Hip Special Tests   Saralyn Pilar (FABER) Test      Saralyn Pilar  [facility]) Test   Findings  Positive    Side  Right    Comments  pain      Transfers   Transfers  Independent with all Transfers                Objective measurements completed on examination: See above findings.      [facility] Adult PT Treatment/Exercise - 08/21/17 0001      Manual Therapy   Manual Therapy  Soft tissue mobilization    Soft tissue mobilization  right gluteus medius, gluteus minimus, piriformis, around right hip       Trigger Point Dry Needling - 08/21/17 0956    Consent Given?  Yes    Education Handout Provided  Yes    Muscles Treated Lower Body  Gluteus minimus;Gluteus maximus           PT Education - 08/21/17 1003    Education Details  information on dry needling    Methods  Explanation;Handout    Comprehension  Verbalized understanding       PT Short Term Goals - 08/21/17 1100      PT SHORT TERM GOAL #1   Title  independent with initial HEP    Time  4    Period  Weeks    Status  New    Target Date  09/18/17      PT SHORT TERM GOAL #2   Title  pain in right hip with driving for 1 hour decreased >/= 25% due to improve mobility    Time  4    Period  Weeks    Status  New    Target Date  09/18/17      PT SHORT TERM GOAL #3   Title  pain in right hip with walking up hills decreased >/= 25%    Time  4    Period  Weeks    Status  New  Target Date  09/18/17        PT Long Term Goals - 08/21/17 1101      PT LONG TERM GOAL #1   Title  independent with HEP and understand how to progress herself     Time  8    Period  Weeks    Status  New    Target Date  10/16/17      PT LONG TERM GOAL #2   Title  walking on incline in neighborhood with pain decreased >/= 75% due to improve right hip strength >/= 4/5    Time  8    Period  Weeks    Status  New    Target Date  10/16/17      PT LONG TERM GOAL #3   Title  crossing legs to put shoes on due ot right hip ER >/= 65 degrees    Time  8    Period  Weeks    Status  New    Target  Date  10/16/17      PT LONG TERM GOAL #4   Title  driving for 1 hour with less pain decreased >/= 75%     Time  8    Period  Weeks    Target Date  10/16/17      PT LONG TERM GOAL #5   Title  laying on right side for sleeping with pain decreased >/= 50%    Time  8    Period  Weeks    Status  New    Target Date  10/16/17             Plan - 08/21/17 0955    Clinical Impression Statement  Patient is a 62 year old female with right hip pain that began in 03/2017 when she [place].  Patient reports her pain level is 3/10 with standing, driving, crossing her legs, and laying on her right side.  Patient has decreased strength in right hip and decreased ROM.  Palpable tenderness located in right gluteus, right greater trochanter, right obturator internist.  Patient has a positive FABER test on the right.  Patient responded well after her dryneedling and had improve tissue mobiltiy.  Patient will benefit from skilled therapy to improve her right hip ROM and strength while reducing her pain to improve her functional movement.     History and Personal Factors relevant to plan of care:  none    Clinical Presentation due to:  stable condition    Clinical Decision Making  Low    Rehab Potential  Excellent    Clinical Impairments Affecting Rehab Potential  none    PT Frequency  2x / week    PT Duration  8 weeks    PT Treatment/Interventions  Cryotherapy;Electrical Stimulation;Moist Heat;Ultrasound;Therapeutic exercise;Therapeutic activities;Neuromuscular re-education;Patient/family education;Manual techniques;Passive range of motion;Dry needling;Iontophoresis 4mg /ml Dexamethasone    PT Next Visit Plan  ionto to right hip if MD signs the initial eval; right hip joint mobilization; dry needing to right hip; soft tissue work, self mobs    Consulted and Agree with Plan of Care  Patient       Patient will benefit from skilled therapeutic intervention in order to improve the  following deficits and impairments:  Pain, Increased fascial restricitons, Increased muscle spasms, Decreased activity tolerance, Decreased endurance, Decreased range of motion, Decreased strength  Visit Diagnosis: Pain in right hip - Plan: PT plan of care cert/re-cert  Stiffness of right hip, not  elsewhere classified - Plan: PT plan of care cert/re-cert  Muscle weakness (generalized) - Plan: PT plan of care cert/re-cert     Problem List Patient Active Problem List   Diagnosis Date Noted  . Cardiac conduction disorder 09/14/2014  . Endometriosis 09/14/2014  . Essential (primary) hypertension 09/14/2014  . Cardiac murmur 09/14/2014  . Adult hypothyroidism 09/14/2014  . Headache, migraine 09/14/2014  . Leiomyoma of uterus 09/14/2014    Earlie Counts, PT 08/21/17 11:05 AM   [facility] [address] [place], [place], [address] Phone: [phone]   Fax:  [phone]  Name: Elim Peale MRN: [id number] Date of Birth: Jun 05, 1955

## 2017-08-21 NOTE — Patient Instructions (Signed)

## 2017-09-04 ENCOUNTER — Ambulatory Visit: Payer: BLUE CROSS/BLUE SHIELD | Attending: Orthopedic Surgery | Admitting: Physical Therapy

## 2017-09-04 ENCOUNTER — Encounter: Payer: Self-pay | Admitting: Physical Therapy

## 2017-09-04 DIAGNOSIS — M6281 Muscle weakness (generalized): Secondary | ICD-10-CM | POA: Diagnosis present

## 2017-09-04 DIAGNOSIS — M25651 Stiffness of right hip, not elsewhere classified: Secondary | ICD-10-CM | POA: Insufficient documentation

## 2017-09-04 DIAGNOSIS — M25551 Pain in right hip: Secondary | ICD-10-CM | POA: Insufficient documentation

## 2017-09-04 NOTE — Patient Instructions (Addendum)
Access Code: [email or web address]  URL: [email or web address]  Date: 09/04/2017  Prepared by: Earlie Counts   Exercises  Supine Quadriceps Stretch with Strap on Table - 2 reps - 1 sets - 30 hold - 1x daily - 7x weekly  Supine Figure 4 Piriformis Stretch - 2 reps - 1 sets - 30 hold - 1x daily - 7x weekly  Supine Piriformis Stretch - 2 reps - 1 sets - 30 sec hold - 1x daily - 7x weekly  Supine Single Knee to Chest Stretch - 2 reps - 1 sets - 30 sec hold - 1x daily - 7x weekly  Beginner Clam - 15 reps - 1 sets - 3 sec hold - 1x daily - 7x weekly  Beginner Bridge - 10 reps - 1 sets - 3 sec hold - 1x daily - 7x weekly    IONTOPHORESIS PATIENT PRECAUTIONS & CONTRAINDICATIONS:  . Redness under one or both electrodes can occur.  This characterized by a uniform redness that usually disappears within 12 hours of treatment. . Small pinhead size blisters may result in response to the drug.  Contact your physician if the problem persists more than 24 hours. . On rare occasions, iontophoresis therapy can result in temporary skin reactions such as rash, inflammation, irritation or burns.  The skin reactions may be the result of individual sensitivity to the ionic solution used, the condition of the skin at the start of treatment, reaction to the materials in the electrodes, allergies or sensitivity to dexamethasone, or a poor connection between the patch and your skin.  Discontinue using iontophoresis if you have any of these reactions and report to your therapist. . Remove the Patch or electrodes if you have any undue sensation of pain or burning during the treatment and report discomfort to your therapist. . Tell your Therapist if you have had known adverse reactions to the application of electrical current. . If using the Patch, the LED light will turn off when treatment is complete and the patch can be removed.  Approximate treatment time is 1-3 hours.  Remove the patch when light goes off or after 6  hours. . The Patch can be worn during normal activity, however excessive motion where the electrodes have been placed can cause poor contact between the skin and the electrode or uneven electrical current resulting in greater risk of skin irritation. Marland Kitchen Keep out of the reach of children.   . DO NOT use if you have a cardiac pacemaker or any other electrically sensitive implanted device. . DO NOT use if you have a known sensitivity to dexamethasone. . DO NOT use during Magnetic Resonance Imaging (MRI). . DO NOT use over broken or compromised skin (e.g. sunburn, cuts, or acne) due to the increased risk of skin reaction. . DO NOT SHAVE over the area to be treated:  To establish good contact between the Patch and the skin, excessive hair may be clipped. . DO NOT place the Patch or electrodes on or over your eyes, directly over your heart, or brain. . DO NOT reuse the Patch or electrodes as this may cause burns to occur. [facility] [address] [place], [place] [address] Phone # [phone] Fax [phone]

## 2017-09-04 NOTE — Therapy (Signed)
[facility] [address], [place], [place], [address] Phone: [phone]   Fax:  [phone]  Physical Therapy Treatment  Patient Details  Name: Marissa Mccarthy MRN: [id number] Date of Birth: 20-Nov-1955 Referring Provider: Dr. Thornton Park   Encounter Date: 09/04/2017  PT End of Session - 09/04/17 1700    Visit Number  2    Date for PT Re-Evaluation  10/16/17    Authorization Type  BCBS    PT Start Time  1615    PT Stop Time  1655    PT Time Calculation (min)  40 min    Activity Tolerance  Patient tolerated treatment well    Behavior During Therapy  [facility] for tasks assessed/performed       History reviewed. No pertinent past medical history.  Past Surgical History:  Procedure Laterality Date  . BARTHOLIN GLAND CYST EXCISION    . DILATION AND CURETTAGE OF UTERUS    . LAPAROSCOPY     re infertility    There were no vitals filed for this visit.  Subjective Assessment - 09/04/17 1619    Subjective  I was on vacation. I was able to take a walk on vacation for 2 miles.     How long can you sit comfortably?  sit to drive the right hip will get sore and ache, better with cruise control with right foot on floor    How long can you walk comfortably?  walk 1 mile on treadmill for 3.1 mph    Diagnostic tests  Arthritis and bone spurs in the right hip from x-ray    Patient Stated Goals  work on pain, strength, mobility    Currently in Pain?  Yes    Pain Score  3     Pain Location  Hip    Pain Orientation  Right    Pain Descriptors / Indicators  Aching    Pain Onset  More than a month ago    Pain Frequency  Intermittent    Aggravating Factors   walking, turning right foot outward, crossing legs, walking in heels, driving, walking up an incline    Pain Relieving Factors  massages every 2 weeks meloxicam    Multiple Pain Sites  No         [facility] PT Assessment - 09/04/17 0001      PROM   Right Hip External  Rotation   65    Right Hip Internal Rotation   15      Right Hip   Right Hip Flexion  120                   [facility] Adult PT Treatment/Exercise - 09/04/17 0001      Knee/Hip Exercises: Aerobic   Nustep  5 min level 3 arm #9 seat #8      Modalities   Modalities  Iontophoresis      Iontophoresis   Type of Iontophoresis  Dexamethasone    Location  posterior right hip    Dose  1 ml   #1   Time  6 hours      Manual Therapy   Manual Therapy  Joint mobilization;Soft tissue mobilization    Manual therapy comments  manually stretched right hip for ER, hip adduction and posterior hip    Joint Mobilization  using a belt to perform right hip mobilization for inferior glide, lateral distraction, pull on right hip    Soft tissue mobilization  soft  tissue work to right gluteal medius and minimus       Trigger Point Dry Needling - 09/04/17 1646    Consent Given?  Yes    Muscles Treated Lower Body  Gluteus minimus;Gluteus maximus    Gluteus Maximus Response  Twitch response elicited;Palpable increased muscle length    Gluteus Minimus Response  Twitch response elicited;Palpable increased muscle length           PT Education - 09/04/17 1659    Education Details  Access Code: [email or web address] ; information on dry needling    Person(s) Educated  Patient    Methods  Explanation;Demonstration;Verbal cues;Handout    Comprehension  Returned demonstration;Verbalized understanding       PT Short Term Goals - 09/04/17 1703      PT SHORT TERM GOAL #1   Title  independent with initial HEP    Time  4    Period  Weeks    Status  On-going      PT SHORT TERM GOAL #2   Title  pain in right hip with driving for 1 hour decreased >/= 25% due to improve mobility    Time  4    Period  Weeks    Status  On-going      PT SHORT TERM GOAL #3   Title  pain in right hip with walking up hills decreased >/= 25%    Time  4    Period  Weeks    Status  On-going        PT Long Term Goals -  08/21/17 1101      PT LONG TERM GOAL #1   Title  independent with HEP and understand how to progress herself     Time  8    Period  Weeks    Status  New    Target Date  10/16/17      PT LONG TERM GOAL #2   Title  walking on incline in neighborhood with pain decreased >/= 75% due to improve right hip strength >/= 4/5    Time  8    Period  Weeks    Status  New    Target Date  10/16/17      PT LONG TERM GOAL #3   Title  crossing legs to put shoes on due ot right hip ER >/= 65 degrees    Time  8    Period  Weeks    Status  New    Target Date  10/16/17      PT LONG TERM GOAL #4   Title  driving for 1 hour with less pain decreased >/= 75%     Time  8    Period  Weeks    Target Date  10/16/17      PT LONG TERM GOAL #5   Title  laying on right side for sleeping with pain decreased >/= 50%    Time  8    Period  Weeks    Status  New    Target Date  10/16/17            Plan - 09/04/17 1701    Clinical Impression Statement  After manual work, patient had increased ROM for right hip with all ROM.  Patient was able [place].  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 Patient will benefit from skilled therapy to imporve her right hip ROM and strength while reducing her pain to improve  her functional movement.     Rehab Potential  Excellent    Clinical Impairments Affecting Rehab Potential  none    PT Frequency  2x / week    PT Duration  8 weeks    PT Treatment/Interventions  Cryotherapy;Electrical Stimulation;Moist Heat;Ultrasound;Therapeutic exercise;Therapeutic activities;Neuromuscular re-education;Patient/family education;Manual techniques;Passive range of motion;Dry needling;Iontophoresis 4mg /ml Dexamethasone    PT Next Visit Plan  ionto #2; right hip joint mobilization; dry needing to right hip; soft tissue work, strength of right hip    PT Home Exercise Plan  Access Code: [email or web address]     Recommended Other Services  MD signed initial eval     Consulted and Agree with Plan of Care  Patient       Patient will benefit from skilled therapeutic intervention in order to improve the following deficits and impairments:  Pain, Increased fascial restricitons, Increased muscle spasms, Decreased activity tolerance, Decreased endurance, Decreased range of motion, Decreased strength  Visit Diagnosis: Pain in right hip  Stiffness of right hip, not elsewhere classified  Muscle weakness (generalized)     Problem List Patient Active Problem List   Diagnosis Date Noted  . Cardiac conduction disorder 09/14/2014  . Endometriosis 09/14/2014  . Essential (primary) hypertension 09/14/2014  . Cardiac murmur 09/14/2014  . Adult hypothyroidism 09/14/2014  . Headache, migraine 09/14/2014  . Leiomyoma of uterus 09/14/2014    Earlie Counts, PT 09/04/17 5:04 PM   [facility] [address], [place], [place], [address] Phone: [phone]   Fax:  [phone]  Name: Cailynn Bodnar MRN: [id number] Date of Birth: 11/25/1955

## 2017-09-06 ENCOUNTER — Encounter: Payer: BLUE CROSS/BLUE SHIELD | Admitting: Physical Therapy

## 2017-09-10 ENCOUNTER — Encounter: Payer: Self-pay | Admitting: Physical Therapy

## 2017-09-10 ENCOUNTER — Ambulatory Visit: Payer: BLUE CROSS/BLUE SHIELD | Admitting: Physical Therapy

## 2017-09-10 DIAGNOSIS — M25551 Pain in right hip: Secondary | ICD-10-CM | POA: Diagnosis not present

## 2017-09-10 DIAGNOSIS — M6281 Muscle weakness (generalized): Secondary | ICD-10-CM

## 2017-09-10 DIAGNOSIS — M25651 Stiffness of right hip, not elsewhere classified: Secondary | ICD-10-CM

## 2017-09-10 NOTE — Therapy (Signed)
[facility] [address], [place], [place], [address] Phone: [phone]   Fax:  [phone]  Physical Therapy Treatment  Patient Details  Name: Marissa Mccarthy MRN: [id number] Date of Birth: 1955-03-21 Referring Provider: Dr. Thornton Park   Encounter Date: 09/10/2017  PT End of Session - 09/10/17 1657    Visit Number  3    Date for PT Re-Evaluation  10/16/17    Authorization Type  BCBS    PT Start Time  1615    PT Stop Time  1655    PT Time Calculation (min)  40 min    Activity Tolerance  Patient tolerated treatment well    Behavior During Therapy  [facility] for tasks assessed/performed       History reviewed. No pertinent past medical history.  Past Surgical History:  Procedure Laterality Date  . BARTHOLIN GLAND CYST EXCISION    . DILATION AND CURETTAGE OF UTERUS    . LAPAROSCOPY     re infertility    There were no vitals filed for this visit.  Subjective Assessment - 09/10/17 1620    Subjective  [place] and was tired without pain.     How long can you sit comfortably?  sit to drive the right hip will get sore and ache, better with cruise control with right foot on floor    How long can you walk comfortably?  walk 1 mile on treadmill for 3.1 mph    Diagnostic tests  Arthritis and bone spurs in the right hip from x-ray    Patient Stated Goals  work on pain, strength, mobility    Currently in Pain?  No/denies    Pain Score  4     Pain Location  Hip    Pain Orientation  Right    Pain Descriptors / Indicators  Grimacing    Pain Onset  More than a month ago    Pain Frequency  Intermittent    Aggravating Factors   step wrong, driving, step on something uneven, crossing legs, driving,                       [facility] Adult PT Treatment/Exercise - 09/10/17 0001      Knee/Hip Exercises: Stretches   Hip Flexor Stretch  Right;2 reps;30 seconds   left foot on  step     Knee/Hip Exercises: Aerobic   Nustep  6 min level  arm #9 seat #8      Knee/Hip Exercises: Standing   Lateral Step Up  Right;1 set;15 reps   stepping on BOSU ball round side   Forward Step Up  Right;1 set;15 reps   step up on the round side of BOSU ball   Other Standing Knee Exercises  stand on right leg and slider on left moving front, backwards, and forwards 10 times    Other Standing Knee Exercises  stand on flat side of BOSU blue-stand with eyes open 15 second 2 times then eyes closed 3 times, squat 10x, move feet side to side for uneven ground      Iontophoresis   Type of Iontophoresis  Dexamethasone    Location  posterior right hip    Dose  1 ml   #2   Time  6 hours      Manual Therapy   Manual Therapy  Joint mobilization;Soft tissue mobilization    Manual therapy comments  manually stretched right hip  for ER, hip adduction and posterior hip    Joint Mobilization  using a belt to perform right hip mobilization for inferior glide, lateral distraction, pull on right hip               PT Short Term Goals - 09/10/17 1700      PT SHORT TERM GOAL #1   Title  independent with initial HEP    Time  4    Period  Weeks    Status  Achieved      PT SHORT TERM GOAL #2   Title  pain in right hip with driving for 1 hour decreased >/= 25% due to improve mobility    Time  4    Period  Weeks    Status  Achieved      PT SHORT TERM GOAL #3   Title  pain in right hip with walking up hills decreased >/= 25%    Time  4    Period  Weeks    Status  Achieved        PT Long Term Goals - 08/21/17 1101      PT LONG TERM GOAL #1   Title  independent with HEP and understand how to progress herself     Time  8    Period  Weeks    Status  New    Target Date  10/16/17      PT LONG TERM GOAL #2   Title  walking on incline in neighborhood with pain decreased >/= 75% due to improve right hip strength >/= 4/5    Time  8    Period  Weeks    Status  New    Target Date   10/16/17      PT LONG TERM GOAL #3   Title  crossing legs to put shoes on due ot right hip ER >/= 65 degrees    Time  8    Period  Weeks    Status  New    Target Date  10/16/17      PT LONG TERM GOAL #4   Title  driving for 1 hour with less pain decreased >/= 75%     Time  8    Period  Weeks    Target Date  10/16/17      PT LONG TERM GOAL #5   Title  laying on right side for sleeping with pain decreased >/= 50%    Time  8    Period  Weeks    Status  New    Target Date  10/16/17            Plan - 09/10/17 1657    Clinical Impression Statement  Patient was able to walk around the park on inclines and 2 miles with fatique but no pain.  Patient is able to cross her legs with more discomfort. Patient still has some difficulty with driving so she is using cruise control.  Patient show weakness in her right hip with single leg weightbear exercises.  Patient has difficulty with standing on flat side of BOSU ball with her eyes closed. Patient will benefit from skilled therapy to improve her right hip ROM and strength while reducig her pain to improve her functional movement.     Rehab Potential  Excellent    Clinical Impairments Affecting Rehab Potential  none    PT Frequency  2x / week    PT Treatment/Interventions  Cryotherapy;Electrical Stimulation;Moist Heat;Ultrasound;Therapeutic exercise;Therapeutic activities;Neuromuscular  re-education;Patient/family education;Manual techniques;Passive range of motion;Dry needling;Iontophoresis 4mg /ml Dexamethasone    PT Next Visit Plan  ionto #3; right hip joint mobilization; , strength of right hip for stabilization    PT Home Exercise Plan  Access Code: [email or web address]     Consulted and Agree with Plan of Care  Patient       Patient will benefit from skilled therapeutic intervention in order to improve the following deficits and impairments:  Pain, Increased fascial restricitons, Increased muscle spasms, Decreased activity tolerance, Decreased  endurance, Decreased range of motion, Decreased strength  Visit Diagnosis: Pain in right hip  Stiffness of right hip, not elsewhere classified  Muscle weakness (generalized)     Problem List Patient Active Problem List   Diagnosis Date Noted  . Cardiac conduction disorder 09/14/2014  . Endometriosis 09/14/2014  . Essential (primary) hypertension 09/14/2014  . Cardiac murmur 09/14/2014  . Adult hypothyroidism 09/14/2014  . Headache, migraine 09/14/2014  . Leiomyoma of uterus 09/14/2014    Earlie Counts, PT 09/10/17 5:01 PM   [facility] [address], [place], [place], [address] Phone: [phone]   Fax:  [phone]  Name: Lizza Huffaker MRN: [id number] Date of Birth: Apr 25, 1955

## 2017-09-12 ENCOUNTER — Ambulatory Visit: Payer: BLUE CROSS/BLUE SHIELD | Admitting: Physical Therapy

## 2017-09-12 ENCOUNTER — Encounter: Payer: Self-pay | Admitting: Physical Therapy

## 2017-09-12 DIAGNOSIS — M25551 Pain in right hip: Secondary | ICD-10-CM

## 2017-09-12 DIAGNOSIS — M6281 Muscle weakness (generalized): Secondary | ICD-10-CM

## 2017-09-12 DIAGNOSIS — M25651 Stiffness of right hip, not elsewhere classified: Secondary | ICD-10-CM

## 2017-09-12 NOTE — Therapy (Signed)
[facility] [address] [place], [place], [address] Phone: [phone]   Fax:  [phone]  Physical Therapy Treatment  Patient Details  Name: Marissa Mccarthy MRN: [id number] Date of Birth: Jun 12, 1955 Referring Provider: Dr. Thornton Park   Encounter Date: 09/12/2017  PT End of Session - 09/12/17 1612    Visit Number  4    Date for PT Re-Evaluation  10/16/17    Authorization Type  BCBS    PT Start Time  1530    PT Stop Time  1610    PT Time Calculation (min)  40 min    Activity Tolerance  Patient tolerated treatment well    Behavior During Therapy  [facility] for tasks assessed/performed       History reviewed. No pertinent past medical history.  Past Surgical History:  Procedure Laterality Date  . BARTHOLIN GLAND CYST EXCISION    . DILATION AND CURETTAGE OF UTERUS    . LAPAROSCOPY     re infertility    There were no vitals filed for this visit.  Subjective Assessment - 09/12/17 1538    Subjective  I feel soreness more than pain. Done with the meloxicam.     How long can you sit comfortably?  sit to drive the right hip will get sore and ache, better with cruise control with right foot on floor    How long can you walk comfortably?  walk 1 mile on treadmill for 3.1 mph    Diagnostic tests  Arthritis and bone spurs in the right hip from x-ray    Patient Stated Goals  work on pain, strength, mobility    Currently in Pain?  Yes    Pain Score  3     Pain Location  Hip    Pain Orientation  Right    Pain Descriptors / Indicators  Sore    Pain Type  Acute pain    Pain Onset  More than a month ago    Pain Frequency  Intermittent    Aggravating Factors   driving    Pain Relieving Factors  massages every 2 weeks    Multiple Pain Sites  No                       [facility] Adult PT Treatment/Exercise - 09/12/17 0001      Knee/Hip Exercises: Stretches   Passive Hamstring Stretch  Right;1 rep;30  seconds    Piriformis Stretch  Right;1 rep;30 seconds      Knee/Hip Exercises: Aerobic   Nustep  6 min level  arm #9 seat #8      Knee/Hip Exercises: Standing   SLS  golfers lift working the right gluteals 7 times    Other Standing Knee Exercises  stand on right leg and slider on left moving front, backwards, and forwards 10 times    Other Standing Knee Exercises  stand on right leg and pull green band out to side and inside;       Knee/Hip Exercises: Sidelying   Other Sidelying Knee/Hip Exercises  right hip abduction pumps, hip circles,       Iontophoresis   Type of Iontophoresis  Dexamethasone    Location  posterior right hip    Dose  1 ml   #3   Time  6 hours               PT Short Term Goals - 09/10/17 1700  PT SHORT TERM GOAL #1   Title  independent with initial HEP    Time  4    Period  Weeks    Status  Achieved      PT SHORT TERM GOAL #2   Title  pain in right hip with driving for 1 hour decreased >/= 25% due to improve mobility    Time  4    Period  Weeks    Status  Achieved      PT SHORT TERM GOAL #3   Title  pain in right hip with walking up hills decreased >/= 25%    Time  4    Period  Weeks    Status  Achieved        PT Long Term Goals - 08/21/17 1101      PT LONG TERM GOAL #1   Title  independent with HEP and understand how to progress herself     Time  8    Period  Weeks    Status  New    Target Date  10/16/17      PT LONG TERM GOAL #2   Title  walking on incline in neighborhood with pain decreased >/= 75% due to improve right hip strength >/= 4/5    Time  8    Period  Weeks    Status  New    Target Date  10/16/17      PT LONG TERM GOAL #3   Title  crossing legs to put shoes on due ot right hip ER >/= 65 degrees    Time  8    Period  Weeks    Status  New    Target Date  10/16/17      PT LONG TERM GOAL #4   Title  driving for 1 hour with less pain decreased >/= 75%     Time  8    Period  Weeks    Target Date  10/16/17       PT LONG TERM GOAL #5   Title  laying on right side for sleeping with pain decreased >/= 50%    Time  8    Period  Weeks    Status  New    Target Date  10/16/17            Plan - 09/12/17 1612    Clinical Impression Statement  Patient has not had pain since last visit.  She just has soreness while driving.  The right posterior hip capsule is tight.  Patient has weakness in right hip for stability exercises.  Patient  able to stand and prop up her right foot to reach when washing it.  Patient will benefi tfrom skilled therapy to improve her right hip ROM and strength while reducing her pain to improve her functional movement.     Rehab Potential  Excellent    Clinical Impairments Affecting Rehab Potential  none    PT Frequency  2x / week    PT Duration  8 weeks    PT Treatment/Interventions  Cryotherapy;Electrical Stimulation;Moist Heat;Ultrasound;Therapeutic exercise;Therapeutic activities;Neuromuscular re-education;Patient/family education;Manual techniques;Passive range of motion;Dry needling;Iontophoresis 4mg /ml Dexamethasone    PT Next Visit Plan  ionto #4; right hip joint mobilization; , strength of right hip for stabilization    PT Home Exercise Plan  Access Code: [email or web address]     Consulted and Agree with Plan of Care  Patient       Patient will benefit from skilled therapeutic intervention in  order to improve the following deficits and impairments:  Pain, Increased fascial restricitons, Increased muscle spasms, Decreased activity tolerance, Decreased endurance, Decreased range of motion, Decreased strength  Visit Diagnosis: Pain in right hip  Stiffness of right hip, not elsewhere classified  Muscle weakness (generalized)     Problem List Patient Active Problem List   Diagnosis Date Noted  . Cardiac conduction disorder 09/14/2014  . Endometriosis 09/14/2014  . Essential (primary) hypertension 09/14/2014  . Cardiac murmur 09/14/2014  . Adult hypothyroidism  09/14/2014  . Headache, migraine 09/14/2014  . Leiomyoma of uterus 09/14/2014    Earlie Counts, PT 09/12/17 4:16 PM   [facility] [address], [place], [place], [address] Phone: [phone]   Fax:  [phone]  Name: Hillari Zumwalt MRN: [id number] Date of Birth: 08-17-1955

## 2017-09-17 ENCOUNTER — Ambulatory Visit: Payer: BLUE CROSS/BLUE SHIELD | Admitting: Physical Therapy

## 2017-09-17 ENCOUNTER — Encounter: Payer: Self-pay | Admitting: Physical Therapy

## 2017-09-17 DIAGNOSIS — M25651 Stiffness of right hip, not elsewhere classified: Secondary | ICD-10-CM

## 2017-09-17 DIAGNOSIS — M25551 Pain in right hip: Secondary | ICD-10-CM | POA: Diagnosis not present

## 2017-09-17 DIAGNOSIS — M6281 Muscle weakness (generalized): Secondary | ICD-10-CM

## 2017-09-17 NOTE — Therapy (Signed)
[facility] [address] [place], [place], [address] Phone: [phone]   Fax:  [phone]  Physical Therapy Treatment  Patient Details  Name: Marissa Mccarthy MRN: [id number] Date of Birth: [birth year] Referring Provider: Dr. Thornton Park   Encounter Date: 09/17/2017  PT End of Session - 09/17/17 1657    Visit Number  5    Date for PT Re-Evaluation  10/16/17    Authorization Type  BCBS    PT Start Time  1615    PT Stop Time  1657    PT Time Calculation (min)  42 min    Activity Tolerance  Patient tolerated treatment well    Behavior During Therapy  [facility] for tasks assessed/performed       History reviewed. No pertinent past medical history.  Past Surgical History:  Procedure Laterality Date  . BARTHOLIN GLAND CYST EXCISION    . DILATION AND CURETTAGE OF UTERUS    . LAPAROSCOPY     re infertility    There were no vitals filed for this visit.  Subjective Assessment - 09/17/17 1622    Subjective  I am off the meloxicam.  I had more tenderness on the right hip.  The advil is helping. No pain with sitting to drive.  Unable to lay on right side to sleep. When going down the steps have some pain. My right hip feels tired.     How long can you sit comfortably?  sit to drive the right hip will get sore and ache, better with cruise control with right foot on floor    How long can you walk comfortably?  walk 1 mile on treadmill for 3.1 mph    Diagnostic tests  Arthritis and bone spurs in the right hip from x-ray    Patient Stated Goals  work on pain, strength, mobility    Currently in Pain?  Yes    Pain Score  2     Pain Location  Hip    Pain Orientation  Right    Pain Descriptors / Indicators  Sore;Aching    Pain Type  Acute pain    Pain Onset  More than a month ago    Pain Frequency  Intermittent    Aggravating Factors   laying on right side    Pain Relieving Factors  massage every 2 weeks; advil    Multiple Pain Sites  No                       [facility] Adult PT Treatment/Exercise - 09/17/17 0001      Lumbar Exercises: Prone   Opposite Arm/Leg Raise  Right arm/Left leg;Left arm/Right leg;10 reps;1 second   tactile cues to keep spinal neutral   Other Prone Lumbar Exercises  prone right hip extension with tactile cues to keep hip down      Knee/Hip Exercises: Stretches   Hip Flexor Stretch  Right;2 reps;30 seconds   left foot on step     Knee/Hip Exercises: Standing   Other Standing Knee Exercises  stand on flat side of BOSU blue-stand with eyes open 15 second 2 times then eyes closed 3 times, squat 10x, move feet side to side for uneven ground   placed hand on right lumbar to increase extension      Manual Therapy   Manual Therapy  Joint mobilization;Soft tissue mobilization    Joint Mobilization  gapping of the right side of L1-L3  Soft tissue mobilization  right quadratus, psoas, right gluteal, right SI joint               PT Short Term Goals - 09/10/17 1700      PT SHORT TERM GOAL #1   Title  independent with initial HEP    Time  4    Period  Weeks    Status  Achieved      PT SHORT TERM GOAL #2   Title  pain in right hip with driving for 1 hour decreased >/= 25% due to improve mobility    Time  4    Period  Weeks    Status  Achieved      PT SHORT TERM GOAL #3   Title  pain in right hip with walking up hills decreased >/= 25%    Time  4    Period  Weeks    Status  Achieved        PT Long Term Goals - 08/21/17 1101      PT LONG TERM GOAL #1   Title  independent with HEP and understand how to progress herself     Time  8    Period  Weeks    Status  New    Target Date  10/16/17      PT LONG TERM GOAL #2   Title  walking on incline in neighborhood with pain decreased >/= 75% due to improve right hip strength >/= 4/5    Time  8    Period  Weeks    Status  New    Target Date  10/16/17      PT LONG TERM GOAL #3   Title  crossing  legs to put shoes on due ot right hip ER >/= 65 degrees    Time  8    Period  Weeks    Status  New    Target Date  10/16/17      PT LONG TERM GOAL #4   Title  driving for 1 hour with less pain decreased >/= 75%     Time  8    Period  Weeks    Target Date  10/16/17      PT LONG TERM GOAL #5   Title  laying on right side for sleeping with pain decreased >/= 50%    Time  8    Period  Weeks    Status  New    Target Date  10/16/17            Plan - 09/17/17 1624    Clinical Impression Statement  Patient has stopped her Meloxicam.  Patient has decreased movement of L3-L5. Patient has tightness located on the right quadratus, SI joint, and gluteals. When patient walks  she will keep her pelvis rotated to the right. Patient had difficulty with right hip extension and after manual work had full right hip extension.  Patient will benefit from skilled therapy to improve her right hip ROM and strength while reducing her pain to improve her functional movement.     Rehab Potential  Excellent    Clinical Impairments Affecting Rehab Potential  none    PT Frequency  2x / week    PT Duration  8 weeks    PT Treatment/Interventions  Cryotherapy;Electrical Stimulation;Moist Heat;Ultrasound;Therapeutic exercise;Therapeutic activities;Neuromuscular re-education;Patient/family education;Manual techniques;Passive range of motion;Dry needling;Iontophoresis 4mg /ml Dexamethasone    PT Next Visit Plan  ionto #4; right hip joint mobilization; , strength of right  hip for stabilization; add hip exercises    PT Home Exercise Plan  Access Code: [email or web address]     Consulted and Agree with Plan of Care  Patient       Patient will benefit from skilled therapeutic intervention in order to improve the following deficits and impairments:  Pain, Increased fascial restricitons, Increased muscle spasms, Decreased activity tolerance, Decreased endurance, Decreased range of motion, Decreased strength  Visit Diagnosis: Pain  in right hip  Stiffness of right hip, not elsewhere classified  Muscle weakness (generalized)     Problem List Patient Active Problem List   Diagnosis Date Noted  . Cardiac conduction disorder 09/14/2014  . Endometriosis 09/14/2014  . Essential (primary) hypertension 09/14/2014  . Cardiac murmur 09/14/2014  . Adult hypothyroidism 09/14/2014  . Headache, migraine 09/14/2014  . Leiomyoma of uterus 09/14/2014    Earlie Counts, PT 09/17/17 5:02 PM   [facility] [address] [place], [place], [address] Phone: [phone]   Fax:  [phone]  Name: Marissa Mccarthy MRN: [id number] Date of Birth: Jun 03, 1955

## 2017-09-19 ENCOUNTER — Ambulatory Visit: Payer: BLUE CROSS/BLUE SHIELD | Admitting: Physical Therapy

## 2017-09-19 ENCOUNTER — Encounter: Payer: Self-pay | Admitting: Physical Therapy

## 2017-09-19 DIAGNOSIS — M25651 Stiffness of right hip, not elsewhere classified: Secondary | ICD-10-CM

## 2017-09-19 DIAGNOSIS — M25551 Pain in right hip: Secondary | ICD-10-CM

## 2017-09-19 DIAGNOSIS — M6281 Muscle weakness (generalized): Secondary | ICD-10-CM

## 2017-09-19 NOTE — Patient Instructions (Signed)
Access Code: [email or web address]  URL: [email or web address]  Date: 09/19/2017  Prepared by: Earlie Counts   Exercises  Supine Quadriceps Stretch with Strap on Table - 2 reps - 1 sets - 30 hold - 1x daily - 7x weekly  Supine Figure 4 Piriformis Stretch - 2 reps - 1 sets - 30 hold - 1x daily - 7x weekly  Supine Piriformis Stretch - 2 reps - 1 sets - 30 sec hold - 1x daily - 7x weekly  Supine Single Knee to Chest Stretch - 2 reps - 1 sets - 30 sec hold - 1x daily - 7x weekly  Clamshell with Resistance - 10 reps - 1 sets - 1x daily - 7x weekly  Bridge with Hip Abduction and Resistance - Ground Touches - 10 reps - 2 sets - 1x daily - 7x weekly  Sidelying Hip Circles - 10 reps - 1 sets - 1x daily - 7x weekly  Standing Hip Diagonal with Resistance Front to Back - 10 reps - 1 sets - 1x daily - 7x weekly  Standing Hip Abduction on Slider - 10 reps - 1 sets - 1x daily - 7x weekly  Standing Caudal Hip Glide with Band Flexion - 10 reps - 1 sets - 10 sec hold - 1x daily - 7x weekly    [facility] [address] [place], [place] [address] Phone # [phone] Fax [phone]

## 2017-09-19 NOTE — Therapy (Signed)
[facility] [address] [place], [place], [address] Phone: [phone]   Fax:  [phone]  Physical Therapy Treatment  Patient Details  Name: Marissa Mccarthy MRN: [id number] Date of Birth: 1955/05/06 Referring Provider: Dr. Thornton Park   Encounter Date: 09/19/2017  PT End of Session - 09/19/17 0850    Visit Number  6    Date for PT Re-Evaluation  10/16/17    Authorization Type  BCBS    PT Start Time  0800    PT Stop Time  0845    PT Time Calculation (min)  45 min    Activity Tolerance  Patient tolerated treatment well;No increased pain    Behavior During Therapy  [facility] for tasks assessed/performed       History reviewed. No pertinent past medical history.  Past Surgical History:  Procedure Laterality Date  . BARTHOLIN GLAND CYST EXCISION    . DILATION AND CURETTAGE OF UTERUS    . LAPAROSCOPY     re infertility    There were no vitals filed for this visit.  Subjective Assessment - 09/19/17 0807    Subjective  I went to tie the left shoe and I can almost tie it. I have had dome soreness.     How long can you sit comfortably?  sit to drive the right hip will get sore and ache, better with cruise control with right foot on floor    How long can you walk comfortably?  walk 1 mile on treadmill for 3.1 mph    Diagnostic tests  Arthritis and bone spurs in the right hip from x-ray    Patient Stated Goals  work on pain, strength, mobility    Currently in Pain?  No/denies    Multiple Pain Sites  No                       [facility] Adult PT Treatment/Exercise - 09/19/17 0001      Knee/Hip Exercises: Stretches   Hip Flexor Stretch  Right;2 reps;30 seconds   left foot on step   Piriformis Stretch  Right;1 rep;30 seconds      Knee/Hip Exercises: Aerobic   Nustep  6 min level   seat #8 level 4      Knee/Hip Exercises: Standing   Other Standing Knee Exercises  stand on right leg and slider on  left moving front, backwards, and forwards 10 times    Other Standing Knee Exercises  diagonal movements of right hip with red band      Knee/Hip Exercises: Sidelying   Other Sidelying Knee/Hip Exercises  right hip abduction pumps, hip circles,       Manual Therapy   Manual Therapy  Joint mobilization;Soft tissue mobilization    Joint Mobilization  mulligan belt around the right hip with distraction and posterior glide of the right femoral head while patient has righ tfoot on chair and leaning forward to her right foot    Soft tissue mobilization  to right side of Lumbar paraspinals whiel left foot on step and reaching to left foot to release the right lumbar area             PT Education - 09/19/17 0847    Education Details  Access Code: [email or web address] revised [email or web address]    Person(s) Educated  Patient    Methods  Explanation;Demonstration;Verbal cues;Handout    Comprehension  Returned demonstration;Verbalized understanding       PT Short Term Goals -  09/10/17 1700      PT SHORT TERM GOAL #1   Title  independent with initial HEP    Time  4    Period  Weeks    Status  Achieved      PT SHORT TERM GOAL #2   Title  pain in right hip with driving for 1 hour decreased >/= 25% due to improve mobility    Time  4    Period  Weeks    Status  Achieved      PT SHORT TERM GOAL #3   Title  pain in right hip with walking up hills decreased >/= 25%    Time  4    Period  Weeks    Status  Achieved        PT Long Term Goals - 09/19/17 5974      PT LONG TERM GOAL #1   Title  independent with HEP and understand how to progress herself     Baseline  just updated    Time  8    Period  Weeks    Status  On-going      PT LONG TERM GOAL #2   Title  walking on incline in neighborhood with pain decreased >/= 75% due to improve right hip strength >/= 4/5    Baseline  50% better    Time  8    Period  Weeks    Status  On-going      PT LONG TERM GOAL #3   Title  crossing legs to put shoes on due  ot right hip ER >/= 65 degrees    Time  8    Period  Weeks    Status  On-going      PT LONG TERM GOAL #4   Title  driving for 1 hour with less pain decreased >/= 75%     Baseline  50% improvement    Time  8    Period  Weeks    Status  On-going      PT LONG TERM GOAL #5   Title  laying on right side for sleeping with pain decreased >/= 50%    Time  8    Period  Weeks    Status  On-going            Plan - 09/19/17 0813    Clinical Impression Statement  Patient continues to have tightness in the posterior right hip and has a HEP to stretch the hip. Patient has update of her HEP to strengthen and stretch right hip. After manual work, patient had improved pelvic rotation to the left with walking.  Patient is just having soreness in the right hip after stopping the Meloxicam. Patient will benefi tfrom skilled therapy to improve her right hip ROM ad strength whiel reducing her pain to improve her functional movement.     Rehab Potential  Excellent    Clinical Impairments Affecting Rehab Potential  none    PT Frequency  2x / week    PT Duration  8 weeks    PT Treatment/Interventions  Cryotherapy;Electrical Stimulation;Moist Heat;Ultrasound;Therapeutic exercise;Therapeutic activities;Neuromuscular re-education;Patient/family education;Manual techniques;Passive range of motion;Dry needling;Iontophoresis 4mg /ml Dexamethasone    PT Next Visit Plan  ionto #4; right hip joint mobilization; , strength of right hip for stabilization    PT Home Exercise Plan  Access Code: [email or web address]     Consulted and Agree with Plan of Care  Patient       Patient will benefit  from skilled therapeutic intervention in order to improve the following deficits and impairments:  Pain, Increased fascial restricitons, Increased muscle spasms, Decreased activity tolerance, Decreased endurance, Decreased range of motion, Decreased strength  Visit Diagnosis: Pain in right hip  Stiffness of right hip, not elsewhere  classified  Muscle weakness (generalized)     Problem List Patient Active Problem List   Diagnosis Date Noted  . Cardiac conduction disorder 09/14/2014  . Endometriosis 09/14/2014  . Essential (primary) hypertension 09/14/2014  . Cardiac murmur 09/14/2014  . Adult hypothyroidism 09/14/2014  . Headache, migraine 09/14/2014  . Leiomyoma of uterus 09/14/2014    Earlie Counts, PT 09/19/17 8:54 AM   [facility] [address], [place], [place], [address] Phone: [phone]   Fax:  [phone]  Name: Marissa Mccarthy MRN: [id number] Date of Birth: March 07, 1955

## 2017-09-24 ENCOUNTER — Ambulatory Visit: Payer: BLUE CROSS/BLUE SHIELD | Attending: Orthopedic Surgery | Admitting: Physical Therapy

## 2017-09-24 ENCOUNTER — Encounter: Payer: Self-pay | Admitting: Physical Therapy

## 2017-09-24 DIAGNOSIS — M6281 Muscle weakness (generalized): Secondary | ICD-10-CM | POA: Diagnosis present

## 2017-09-24 DIAGNOSIS — M25651 Stiffness of right hip, not elsewhere classified: Secondary | ICD-10-CM | POA: Diagnosis present

## 2017-09-24 DIAGNOSIS — M25551 Pain in right hip: Secondary | ICD-10-CM | POA: Insufficient documentation

## 2017-09-24 NOTE — Therapy (Signed)
[facility] [address] [place], [place], [address] Phone: [phone]   Fax:  [phone]  Physical Therapy Treatment  Patient Details  Name: Marissa Mccarthy MRN: [id number] Date of Birth: 20-Jan-1956 Referring Provider: Dr. Thornton Park   Encounter Date: 09/24/2017  PT End of Session - 09/24/17 0854    Visit Number  7    Date for PT Re-Evaluation  10/16/17    Authorization Type  BCBS    PT Start Time  0800    PT Stop Time  0840    PT Time Calculation (min)  40 min    Activity Tolerance  Patient tolerated treatment well;No increased pain    Behavior During Therapy  [facility] for tasks assessed/performed       History reviewed. No pertinent past medical history.  Past Surgical History:  Procedure Laterality Date  . BARTHOLIN GLAND CYST EXCISION    . DILATION AND CURETTAGE OF UTERUS    . LAPAROSCOPY     re infertility    There were no vitals filed for this visit.  Subjective Assessment - 09/24/17 0804    Subjective  Walk around [employer] with greater ease. The new home program has been helping.  My hip is sore and not keeping me from doing things. I did yogalates this morning. I can get down to my shoe with greater ease.     How long can you sit comfortably?  sit to drive the right hip will get sore and ache, better with cruise control with right foot on floor    How long can you walk comfortably?  walk 1 mile on treadmill for 3.1 mph    Diagnostic tests  Arthritis and bone spurs in the right hip from x-ray    Patient Stated Goals  work on pain, strength, mobility    Currently in Pain?  Yes    Pain Score  2     Pain Location  Hip    Pain Orientation  Right    Pain Descriptors / Indicators  Sore    Pain Type  Acute pain    Pain Onset  More than a month ago    Pain Frequency  Intermittent    Aggravating Factors   laying on right side after awhile    Pain Relieving Factors  massage every 2 weeks; advil     Multiple Pain Sites  No                       [facility] Adult PT Treatment/Exercise - 09/24/17 0001      Knee/Hip Exercises: Stretches   Hip Flexor Stretch  Right;2 reps;30 seconds   left foot on step     Knee/Hip Exercises: Aerobic   Nustep  6 min level   seat #8 level 4      Manual Therapy   Manual Therapy  Joint mobilization;Soft tissue mobilization    Manual therapy comments  used an assistive device to work on the lumbar and gluteal on the right    Joint Mobilization  mulligan belt around the right hip with distraction and posterior glide of the right femoral head while patient has righ tfoot on chair and leaning forward to her right foot; standing using th emulligan belt to move the femooral head psoteiro laterally while she is moving into right hip abduction in standing; sitting using mulligan belt to move the right femoral head laterally wiht right hip ER and having patient  flex at hip.     Soft tissue mobilization  right lumbar paraspinals and quadratus; right gluteaus and TFL       Trigger Point Dry Needling - 09/24/17 0823    Consent Given?  Yes    Muscles Treated Lower Body  Gluteus minimus;Gluteus maximus   right lumbar multifidi   Gluteus Maximus Response  Twitch response elicited;Palpable increased muscle length    Gluteus Minimus Response  Twitch response elicited;Palpable increased muscle length             PT Short Term Goals - 09/10/17 1700      PT SHORT TERM GOAL #1   Title  independent with initial HEP    Time  4    Period  Weeks    Status  Achieved      PT SHORT TERM GOAL #2   Title  pain in right hip with driving for 1 hour decreased >/= 25% due to improve mobility    Time  4    Period  Weeks    Status  Achieved      PT SHORT TERM GOAL #3   Title  pain in right hip with walking up hills decreased >/= 25%    Time  4    Period  Weeks    Status  Achieved        PT Long Term Goals - 09/24/17 4765      PT LONG TERM GOAL #1    Title  independent with HEP and understand how to progress herself     Baseline  still learning    Time  8    Period  Weeks    Status  On-going      PT LONG TERM GOAL #2   Title  walking on incline in neighborhood with pain decreased >/= 75% due to improve right hip strength >/= 4/5    Baseline  50% better    Time  8    Period  Weeks    Status  On-going      PT LONG TERM GOAL #3   Title  crossing legs to put shoes on due ot right hip ER >/= 65 degrees    Time  8    Period  Weeks    Status  On-going      PT LONG TERM GOAL #4   Title  driving for 1 hour with less pain decreased >/= 75%     Baseline  50% improvement    Time  8    Period  Weeks    Status  On-going      PT LONG TERM GOAL #5   Title  laying on right side for sleeping with pain decreased >/= 50%    Time  8    Period  Weeks    Status  On-going            Plan - 09/24/17 0809    Clinical Impression Statement  Patient is not taking the Meloixcam and is just having soreness in right hip at level 2/10.  Patient was able to bend forward with 1 inch from floor to finger tips due to improved right lumbar and hip mobility.  Patient is able to walk with no pelvic rotation after manual work due to improved lumbar and hip motion. Patient reports it is easier to reach for her shoe to tie her shoes. Patient will benefi tfrom skilled therapy to improve her right hip ROM and strength while reducing her  pain to improve her functional movement.     Rehab Potential  Excellent    Clinical Impairments Affecting Rehab Potential  none    PT Frequency  2x / week    PT Duration  8 weeks    PT Treatment/Interventions  Cryotherapy;Electrical Stimulation;Moist Heat;Ultrasound;Therapeutic exercise;Therapeutic activities;Neuromuscular re-education;Patient/family education;Manual techniques;Passive range of motion;Dry needling;Iontophoresis 4mg /ml Dexamethasone    PT Next Visit Plan  ionto #4; right hip joint mobilization; , strength of  right hip for stabilization; measure right hip motion    PT Home Exercise Plan  Access Code: [email or web address]     Consulted and Agree with Plan of Care  Patient       Patient will benefit from skilled therapeutic intervention in order to improve the following deficits and impairments:  Pain, Increased fascial restricitons, Increased muscle spasms, Decreased activity tolerance, Decreased endurance, Decreased range of motion, Decreased strength  Visit Diagnosis: Pain in right hip  Stiffness of right hip, not elsewhere classified  Muscle weakness (generalized)     Problem List Patient Active Problem List   Diagnosis Date Noted  . Cardiac conduction disorder 09/14/2014  . Endometriosis 09/14/2014  . Essential (primary) hypertension 09/14/2014  . Cardiac murmur 09/14/2014  . Adult hypothyroidism 09/14/2014  . Headache, migraine 09/14/2014  . Leiomyoma of uterus 09/14/2014    Earlie Counts, PT 09/24/17 9:00 AM   [facility] [address] [place], [place], [address] Phone: [phone]   Fax:  [phone]  Name: Marissa Mccarthy MRN: [id number] Date of Birth: 26-May-1955

## 2017-10-03 ENCOUNTER — Encounter: Payer: Self-pay | Admitting: Physical Therapy

## 2017-10-03 ENCOUNTER — Ambulatory Visit: Payer: BLUE CROSS/BLUE SHIELD | Admitting: Physical Therapy

## 2017-10-03 DIAGNOSIS — M25551 Pain in right hip: Secondary | ICD-10-CM

## 2017-10-03 DIAGNOSIS — M25651 Stiffness of right hip, not elsewhere classified: Secondary | ICD-10-CM

## 2017-10-03 DIAGNOSIS — M6281 Muscle weakness (generalized): Secondary | ICD-10-CM

## 2017-10-03 NOTE — Therapy (Signed)
[facility] [address], [place], [place], [address] Phone: [phone]   Fax:  [phone]  Physical Therapy Treatment  Patient Details  Name: Marissa Mccarthy MRN: [id number] Date of Birth: 1955/10/05 Referring Provider: Dr. Thornton Park   Encounter Date: 10/03/2017  PT End of Session - 10/03/17 0851    Visit Number  8    Date for PT Re-Evaluation  10/16/17    Authorization Type  BCBS    PT Start Time  0845    PT Stop Time  0925    PT Time Calculation (min)  40 min    Activity Tolerance  Patient tolerated treatment well;No increased pain    Behavior During Therapy  [facility] for tasks assessed/performed       History reviewed. No pertinent past medical history.  Past Surgical History:  Procedure Laterality Date  . BARTHOLIN GLAND CYST EXCISION    . DILATION AND CURETTAGE OF UTERUS    . LAPAROSCOPY     re infertility    There were no vitals filed for this visit.  Subjective Assessment - 10/03/17 0849    Subjective  I did alot of this weekend being away.  I had to walk at the [employer].  I have increased pain.     How long can you sit comfortably?  sit to drive the right hip will get sore and ache, better with cruise control with right foot on floor    How long can you walk comfortably?  walk 1 mile on treadmill for 3.1 mph    Diagnostic tests  Arthritis and bone spurs in the right hip from x-ray    Patient Stated Goals  work on pain, strength, mobility    Currently in Pain?  Yes    Pain Score  2    this weekend went to 7/10   Pain Location  Hip    Pain Orientation  Right    Pain Descriptors / Indicators  Aching;Dull    Pain Type  Acute pain    Pain Onset  More than a month ago    Pain Frequency  Intermittent    Aggravating Factors   laying on right side after awhile, walking    Pain Relieving Factors  massage every 2 weeks; advil    Multiple Pain Sites  No         [facility] PT Assessment -  10/03/17 0001      Strength   Right Hip External Rotation   4/5    Right Hip Internal Rotation  4/5    Right Hip ABduction  4-/5      Right Hip   Right Hip Extension  5                   [facility] Adult PT Treatment/Exercise - 10/03/17 0001      Knee/Hip Exercises: Aerobic   Stationary Bike  level 2 for 5 min      Manual Therapy   Manual Therapy  Soft tissue mobilization    Soft tissue mobilization  righ tlumbar and gluteal muscles       Trigger Point Dry Needling - 10/03/17 0921    Consent Given?  Yes    Muscles Treated Lower Body  Gluteus minimus;Gluteus maximus   bil. lumbar multifidi L4-L5   Gluteus Maximus Response  Twitch response elicited;Palpable increased muscle length    Gluteus Minimus Response  Twitch response elicited;Palpable increased muscle length  PT Short Term Goals - 09/10/17 1700      PT SHORT TERM GOAL #1   Title  independent with initial HEP    Time  4    Period  Weeks    Status  Achieved      PT SHORT TERM GOAL #2   Title  pain in right hip with driving for 1 hour decreased >/= 25% due to improve mobility    Time  4    Period  Weeks    Status  Achieved      PT SHORT TERM GOAL #3   Title  pain in right hip with walking up hills decreased >/= 25%    Time  4    Period  Weeks    Status  Achieved        PT Long Term Goals - 09/24/17 0859      PT LONG TERM GOAL #1   Title  independent with HEP and understand how to progress herself     Baseline  still learning    Time  8    Period  Weeks    Status  On-going      PT LONG TERM GOAL #2   Title  walking on incline in neighborhood with pain decreased >/= 75% due to improve right hip strength >/= 4/5    Baseline  50% better    Time  8    Period  Weeks    Status  On-going      PT LONG TERM GOAL #3   Title  crossing legs to put shoes on due ot right hip ER >/= 65 degrees    Time  8    Period  Weeks    Status  On-going      PT LONG TERM GOAL #4   Title   driving for 1 hour with less pain decreased >/= 75%     Baseline  50% improvement    Time  8    Period  Weeks    Status  On-going      PT LONG TERM GOAL #5   Title  laying on right side for sleeping with pain decreased >/= 50%    Time  8    Period  Weeks    Status  On-going            Plan - 10/03/17 0925    Clinical Impression Statement  Patient has not met goals today due to a flare-up from the weekend.  Patient had increased trigger points in the right lumbar and gluteals.  Patient pain level after soft tissue work decreased to 0/10. Patient right hip extension increased to 5 degrees A/ROM.  Right hip abduction increased to 4-/5 for strength.  Patient is able to contract her right gluteals correctly wiht right hip extension.  Patient will benefit from skilled therapy to improve her right hip ROM and strength while reducing her pain to improve her functional movement.     Rehab Potential  Excellent    Clinical Impairments Affecting Rehab Potential  none    PT Frequency  2x / week    PT Duration  8 weeks    PT Treatment/Interventions  Cryotherapy;Electrical Stimulation;Moist Heat;Ultrasound;Therapeutic exercise;Therapeutic activities;Neuromuscular re-education;Patient/family education;Manual techniques;Passive range of motion;Dry needling;Iontophoresis '4mg'$ /ml Dexamethasone    PT Next Visit Plan  ionto #4; right hip joint mobilization; , strength of right hip for stabilization; measure right hip motion    PT Home Exercise Plan  Access Code: [email or web address]  Consulted and Agree with Plan of Care  Patient       Patient will benefit from skilled therapeutic intervention in order to improve the following deficits and impairments:  Pain, Increased fascial restricitons, Increased muscle spasms, Decreased activity tolerance, Decreased endurance, Decreased range of motion, Decreased strength  Visit Diagnosis: Pain in right hip  Stiffness of right hip, not elsewhere classified  Muscle  weakness (generalized)     Problem List Patient Active Problem List   Diagnosis Date Noted  . Cardiac conduction disorder 09/14/2014  . Endometriosis 09/14/2014  . Essential (primary) hypertension 09/14/2014  . Cardiac murmur 09/14/2014  . Adult hypothyroidism 09/14/2014  . Headache, migraine 09/14/2014  . Leiomyoma of uterus 09/14/2014    Earlie Counts, PT 10/03/17 9:28 AM   [facility] [address] [place], [place], [address] Phone: [phone]   Fax:  [phone]  Name: Marissa Mccarthy MRN: [id number] Date of Birth: April 08, 1955

## 2017-10-08 ENCOUNTER — Encounter: Payer: Self-pay | Admitting: Physical Therapy

## 2017-10-08 ENCOUNTER — Ambulatory Visit: Payer: BLUE CROSS/BLUE SHIELD | Admitting: Physical Therapy

## 2017-10-08 DIAGNOSIS — M6281 Muscle weakness (generalized): Secondary | ICD-10-CM

## 2017-10-08 DIAGNOSIS — M25551 Pain in right hip: Secondary | ICD-10-CM | POA: Diagnosis not present

## 2017-10-08 DIAGNOSIS — M25651 Stiffness of right hip, not elsewhere classified: Secondary | ICD-10-CM

## 2017-10-08 NOTE — Therapy (Signed)
[facility] [address] [place], [place], [address] Phone: [phone]   Fax:  [phone]  Physical Therapy Treatment  Patient Details  Name: Marissa Mccarthy MRN: [id number] Date of Birth: 04-07-1955 Referring Provider: Dr. Thornton Park   Encounter Date: 10/08/2017  PT End of Session - 10/08/17 0923    Visit Number  9    Date for PT Re-Evaluation  10/16/17    Authorization Type  BCBS    PT Start Time  0845    PT Stop Time  0923    PT Time Calculation (min)  38 min    Activity Tolerance  Patient tolerated treatment well;No increased pain    Behavior During Therapy  [facility] for tasks assessed/performed       History reviewed. No pertinent past medical history.  Past Surgical History:  Procedure Laterality Date  . BARTHOLIN GLAND CYST EXCISION    . DILATION AND CURETTAGE OF UTERUS    . LAPAROSCOPY     re infertility    There were no vitals filed for this visit.  Subjective Assessment - 10/08/17 0850    Subjective  I had to wear heels all day yesterday.  My hip did well. I have a little sorness this morning. After last treatment I felt alot better. I was in a car for 6 hours and did okay.     How long can you sit comfortably?  sit to drive the right hip will get sore and ache, better with cruise control with right foot on floor    How long can you walk comfortably?  walk 1 mile on treadmill for 3.1 mph    Diagnostic tests  Arthritis and bone spurs in the right hip from x-ray    Patient Stated Goals  work on pain, strength, mobility    Currently in Pain?  Yes    Pain Score  1     Pain Location  Hip    Pain Orientation  Right    Pain Descriptors / Indicators  Aching;Dull    Pain Type  Acute pain    Pain Onset  More than a month ago    Pain Frequency  Intermittent    Aggravating Factors   laying on right side after awhile, walking    Pain Relieving Factors  massage every 2 weeks, advil    Multiple Pain  Sites  No                       [facility] Adult PT Treatment/Exercise - 10/08/17 0001      Knee/Hip Exercises: Stretches   Passive Hamstring Stretch  Right;2 reps;30 seconds    Quad Stretch  Right;2 reps;30 seconds   prone   Hip Flexor Stretch  Right;2 reps;30 seconds   left foot on step; over pressure in hip   Piriformis Stretch  Right;2 reps;30 seconds   supine   Other Knee/Hip Stretches  right hip adductor stretch hold 30 sec 2 times wiht right foot on second step      Knee/Hip Exercises: Aerobic   Nustep  6 min level 4  seat #8       Knee/Hip Exercises: Standing   Other Standing Knee Exercises  stand on left leg wiht righ tfoot on slider with circles, push out, push backward    Other Standing Knee Exercises  plie 15 times; stand on left and bring right foot out  to side then behind right leg.  Knee/Hip Exercises: Sidelying   Hip ABduction  Strengthening;Right;1 set;15 reps    Clams  10 times with therapist keeping the right hip still, knee flexed to 90 degrees    Other Sidelying Knee/Hip Exercises  left sidely move right foot in front of leg then behind               PT Short Term Goals - 09/10/17 1700      PT SHORT TERM GOAL #1   Title  independent with initial HEP    Time  4    Period  Weeks    Status  Achieved      PT SHORT TERM GOAL #2   Title  pain in right hip with driving for 1 hour decreased >/= 25% due to improve mobility    Time  4    Period  Weeks    Status  Achieved      PT SHORT TERM GOAL #3   Title  pain in right hip with walking up hills decreased >/= 25%    Time  4    Period  Weeks    Status  Achieved        PT Long Term Goals - 10/08/17 2725      PT LONG TERM GOAL #1   Title  independent with HEP and understand how to progress herself     Baseline  still learning    Time  8    Period  Weeks    Status  On-going      PT LONG TERM GOAL #2   Title  walking on incline in neighborhood with pain decreased >/= 75% due  to improve right hip strength >/= 4/5    Baseline  50% better    Time  8    Period  Weeks    Status  On-going      PT LONG TERM GOAL #3   Title  crossing legs to put shoes on due ot right hip ER >/= 65 degrees    Time  8    Period  Weeks    Status  On-going      PT LONG TERM GOAL #4   Title  driving for 1 hour with less pain decreased >/= 75%     Baseline  50% improvement    Time  8    Period  Weeks    Status  On-going      PT LONG TERM GOAL #5   Title  laying on right side for sleeping with pain decreased >/= 50%    Time  8    Period  Weeks    Status  On-going            Plan - 10/08/17 3664    Clinical Impression Statement  Patient pain level is low at 1/10. Patient needs isolated right hip strength in slightly non-weightbear position.  Patient felt muscle fatique after therapy.  Patient still has some tightness in the right hip external rotation.  Patient was able to walk in heels for 1 day with increased pain.  Patient will benefit from skilled therapy to improve her right hip ROM and strength while reducing her pain to improve her functional movement.     Rehab Potential  Excellent    Clinical Impairments Affecting Rehab Potential  none    PT Frequency  2x / week    PT Duration  8 weeks    PT Treatment/Interventions  Cryotherapy;Electrical Stimulation;Moist Heat;Ultrasound;Therapeutic exercise;Therapeutic activities;Neuromuscular re-education;Patient/family  education;Manual techniques;Passive range of motion;Dry needling;Iontophoresis 4mg /ml Dexamethasone    PT Next Visit Plan  ionto #4; right hip joint mobilization; , strength of right hip for stabilization; possible discharge end of next week    PT Home Exercise Plan  Access Code: [email or web address]     Consulted and Agree with Plan of Care  Patient       Patient will benefit from skilled therapeutic intervention in order to improve the following deficits and impairments:  Pain, Increased fascial restricitons, Increased  muscle spasms, Decreased activity tolerance, Decreased endurance, Decreased range of motion, Decreased strength  Visit Diagnosis: Pain in right hip  Stiffness of right hip, not elsewhere classified  Muscle weakness (generalized)     Problem List Patient Active Problem List   Diagnosis Date Noted  . Cardiac conduction disorder 09/14/2014  . Endometriosis 09/14/2014  . Essential (primary) hypertension 09/14/2014  . Cardiac murmur 09/14/2014  . Adult hypothyroidism 09/14/2014  . Headache, migraine 09/14/2014  . Leiomyoma of uterus 09/14/2014    Earlie Counts, PT 10/08/17 9:28 AM   [facility] [address] [place], [place], [address] Phone: [phone]   Fax:  [phone]  Name: Marissa Mccarthy MRN: [id number] Date of Birth: 1955-10-11

## 2017-10-14 ENCOUNTER — Encounter: Payer: Self-pay | Admitting: Physical Therapy

## 2017-10-14 ENCOUNTER — Ambulatory Visit: Payer: BLUE CROSS/BLUE SHIELD | Admitting: Physical Therapy

## 2017-10-14 DIAGNOSIS — M25551 Pain in right hip: Secondary | ICD-10-CM | POA: Diagnosis not present

## 2017-10-14 DIAGNOSIS — M25651 Stiffness of right hip, not elsewhere classified: Secondary | ICD-10-CM

## 2017-10-14 DIAGNOSIS — M6281 Muscle weakness (generalized): Secondary | ICD-10-CM

## 2017-10-14 NOTE — Patient Instructions (Addendum)
Single Leg Circle    Lie on back, one leg bent, other leg straight up. Inhale, circling leg across body, and exhale while circling down and around to beginning. Maintain still pelvis; avoid rocking. Keep circle small. Repeat _10___ times clockwise, then counterclockwise. Repeat with other leg. Do ___1_ sessions per day.  http://pm.exer.us/14   Copyright  VHI. All rights reserved.   Single Leg Raise    Lie on back, one leg bent, other leg straight on mat. Inhale, raising straight leg toward ceiling. Keep hips on mat. Exhale, lowering leg to mat. Repeat __10__ times. Repeat with other leg. Do _1___ sessions per day.  http://pm.exer.us/12   Copyright  VHI. All rights reserved.  Side Leg Lift    Lie on side, back straight along edge of mat, legs 30 in front of torso. Flexing foot, lift top leg to 45 without hiking hip. Lower leg, foot pointed. Repeat __10__ times. Repeat on other side. Do __1__ sessions per day.  http://pm.exer.us/58   Copyright  VHI. All rights reserved.  Prone Single Leg Raise    Lie on stomach, forehead on hands. Exhale, raising one leg, front of hip on the mat. Inhale, lowering leg. Repeat _10___ times, alternating legs. Do __1__ sessions per day.  http://pm.exer.us/40   Copyright  VHI. All rights reserved.  Side Kick    Lie on side, back straight along edge of mat, legs 30 in front of torso. Lift top leg to hip height, foot flexed. Exhale, kicking forward twice. Inhale, kicking once backward with pointed foot. Keep leg hip height, torso still. Repeat _10___ times. Repeat on other side. Do __1__ sessions per day.  http://pm.exer.us/56   Copyright  VHI. All rights reserved.  Clam    Lie on side, legs bent 90. Open top knee to ceiling, rotating leg outward. Touch toes to ankle of bottom leg. Repeat __10__ times. Repeat on other side. Do __1__ sessions per day.  http://pm.exer.us/148   Copyright  VHI. All rights reserved.  Prone Leg  Beats    Lie on stomach, forehead on hands. Exhale, raising legs, slightly turned out. Inhale, beating heels together for __1__ small beats. Exhale, lowering legs. Repeat __2__ times. Do __1__ sessions per day.  http://pm.exer.us/110   Copyright  VHI. All rights reserved.  [facility] [address], [place], [place] [address] Phone # [phone] Fax [phone]

## 2017-10-14 NOTE — Therapy (Signed)
[facility] [address], [place], [place], [address] Phone: [phone]   Fax:  [phone]  Physical Therapy Treatment  Patient Details  Name: Marissa Mccarthy MRN: [id number] Date of Birth: 1955-11-30 Referring Provider: Dr. Thornton Park   Encounter Date: 10/14/2017  PT End of Session - 10/14/17 1658    Visit Number  10    Date for PT Re-Evaluation  10/16/17    Authorization Type  BCBS    PT Start Time  4967    PT Stop Time  1655    PT Time Calculation (min)  40 min    Activity Tolerance  Patient tolerated treatment well;No increased pain    Behavior During Therapy  [facility] for tasks assessed/performed       History reviewed. No pertinent past medical history.  Past Surgical History:  Procedure Laterality Date  . BARTHOLIN GLAND CYST EXCISION    . DILATION AND CURETTAGE OF UTERUS    . LAPAROSCOPY     re infertility    There were no vitals filed for this visit.  Subjective Assessment - 10/14/17 1619    Subjective  I have had crazy weekend and sat in a car and no increase in pain.     How long can you sit comfortably?  sit to drive the right hip will get sore and ache, better with cruise control with right foot on floor    How long can you walk comfortably?  walk 1 mile on treadmill for 3.1 mph    Diagnostic tests  Arthritis and bone spurs in the right hip from x-ray    Patient Stated Goals  work on pain, strength, mobility    Currently in Pain?  No/denies    Multiple Pain Sites  No                       [facility] Adult PT Treatment/Exercise - 10/14/17 0001      Pilates   Other Pilates  supine hip circles      Knee/Hip Exercises: Stretches   Passive Hamstring Stretch  Right;2 reps;30 seconds    Quad Stretch  Right;2 reps;30 seconds   prone   Hip Flexor Stretch  Right;2 reps;30 seconds   left foot on step; over pressure in hip   Piriformis Stretch  Right;2 reps;30 seconds   supine    Other Knee/Hip Stretches  right hip adductor stretch hold 30 sec 2 times wiht right foot on second step      Knee/Hip Exercises: Aerobic   Nustep  6 min level 4  seat #8       Knee/Hip Exercises: Standing   Other Standing Knee Exercises  stand on left leg wiht righ tfoot on slider with circles, push out, push backward    Other Standing Knee Exercises  plie 15 times; stand on left and bring right foot out  to side then behind right leg.       Knee/Hip Exercises: Sidelying   Hip ABduction  Strengthening;Right;1 set;15 reps    Clams  10x2  times with therapist keeping the right hip still, knee flexed to 90 degrees    Other Sidelying Knee/Hip Exercises  left sidely move right foot in front of leg then behind             PT Education - 10/14/17 1657    Education Details  hip strengthening    Person(s) Educated  Patient    Methods  Explanation;Demonstration;Verbal  cues;Handout    Comprehension  Verbalized understanding;Returned demonstration       PT Short Term Goals - 09/10/17 1700      PT SHORT TERM GOAL #1   Title  independent with initial HEP    Time  4    Period  Weeks    Status  Achieved      PT SHORT TERM GOAL #2   Title  pain in right hip with driving for 1 hour decreased >/= 25% due to improve mobility    Time  4    Period  Weeks    Status  Achieved      PT SHORT TERM GOAL #3   Title  pain in right hip with walking up hills decreased >/= 25%    Time  4    Period  Weeks    Status  Achieved        PT Long Term Goals - 10/08/17 7989      PT LONG TERM GOAL #1   Title  independent with HEP and understand how to progress herself     Baseline  still learning    Time  8    Period  Weeks    Status  On-going      PT LONG TERM GOAL #2   Title  walking on incline in neighborhood with pain decreased >/= 75% due to improve right hip strength >/= 4/5    Baseline  50% better    Time  8    Period  Weeks    Status  On-going      PT LONG TERM GOAL #3   Title   crossing legs to put shoes on due ot right hip ER >/= 65 degrees    Time  8    Period  Weeks    Status  On-going      PT LONG TERM GOAL #4   Title  driving for 1 hour with less pain decreased >/= 75%     Baseline  50% improvement    Time  8    Period  Weeks    Status  On-going      PT LONG TERM GOAL #5   Title  laying on right side for sleeping with pain decreased >/= 50%    Time  8    Period  Weeks    Status  On-going            Plan - 10/14/17 1658    Clinical Impression Statement  Patient was able to travel in a car and be busy while away with no increase in right hip pain.  Patient is doing well with her HEP.  Patient was able to so her exercises correctly.  Patient has 1 more visit to review HEP. Patient will benefit from skilled therapy to  improve her right hip ROM and strength while reducing her pain to improve her functional movement.     Rehab Potential  Excellent    Clinical Impairments Affecting Rehab Potential  none    PT Frequency  2x / week    PT Duration  8 weeks    PT Treatment/Interventions  Cryotherapy;Electrical Stimulation;Moist Heat;Ultrasound;Therapeutic exercise;Therapeutic activities;Neuromuscular re-education;Patient/family education;Manual techniques;Passive range of motion;Dry needling;Iontophoresis 4mg /ml Dexamethasone    PT Next Visit Plan  ionto #4; right hip joint mobilization; , strength of right hip for stabilization; discharge next visit    PT Home Exercise Plan  Access Code: [email or web address]     Consulted and Agree with Plan  of Care  Patient       Patient will benefit from skilled therapeutic intervention in order to improve the following deficits and impairments:  Pain, Increased fascial restricitons, Increased muscle spasms, Decreased activity tolerance, Decreased endurance, Decreased range of motion, Decreased strength  Visit Diagnosis: Pain in right hip  Stiffness of right hip, not elsewhere classified  Muscle weakness  (generalized)     Problem List Patient Active Problem List   Diagnosis Date Noted  . Cardiac conduction disorder 09/14/2014  . Endometriosis 09/14/2014  . Essential (primary) hypertension 09/14/2014  . Cardiac murmur 09/14/2014  . Adult hypothyroidism 09/14/2014  . Headache, migraine 09/14/2014  . Leiomyoma of uterus 09/14/2014    Earlie Counts, PT 10/14/17 5:01 PM   [facility] [address], [place], [place], [address] Phone: [phone]   Fax:  [phone]  Name: Marissa Mccarthy MRN: [id number] Date of Birth: 08/26/1955

## 2017-10-16 ENCOUNTER — Ambulatory Visit: Payer: BLUE CROSS/BLUE SHIELD | Admitting: Physical Therapy

## 2017-10-16 ENCOUNTER — Encounter: Payer: Self-pay | Admitting: Physical Therapy

## 2017-10-16 DIAGNOSIS — M6281 Muscle weakness (generalized): Secondary | ICD-10-CM

## 2017-10-16 DIAGNOSIS — M25551 Pain in right hip: Secondary | ICD-10-CM | POA: Diagnosis not present

## 2017-10-16 DIAGNOSIS — M25651 Stiffness of right hip, not elsewhere classified: Secondary | ICD-10-CM

## 2017-10-16 NOTE — Therapy (Signed)
[facility] [address] [place], [place], [address] Phone: [phone]   Fax:  [phone]  Physical Therapy Treatment  Patient Details  Name: Marissa Mccarthy MRN: [id number] Date of Birth: [birth year] Referring Provider: Dr. Thornton Park   Encounter Date: 10/16/2017  PT End of Session - 10/16/17 1656    Visit Number  11    Date for PT Re-Evaluation  10/16/17    Authorization Type  BCBS    PT Start Time  3299    PT Stop Time  2426    PT Time Calculation (min)  38 min    Activity Tolerance  Patient tolerated treatment well;No increased pain    Behavior During Therapy  [facility] for tasks assessed/performed       History reviewed. No pertinent past medical history.  Past Surgical History:  Procedure Laterality Date  . BARTHOLIN GLAND CYST EXCISION    . DILATION AND CURETTAGE OF UTERUS    . LAPAROSCOPY     re infertility    There were no vitals filed for this visit.  Subjective Assessment - 10/16/17 1620    Subjective  I just had a massage and feel better.     How long can you sit comfortably?  sit to drive the right hip will get sore and ache, better with cruise control with right foot on floor    How long can you walk comfortably?  walk 1 mile on treadmill for 3.1 mph    Diagnostic tests  Arthritis and bone spurs in the right hip from x-ray    Patient Stated Goals  work on pain, strength, mobility    Currently in Pain?  No/denies    Multiple Pain Sites  No         [facility] PT Assessment - 10/16/17 0001      Assessment   Medical Diagnosis  M16.11 Unilateral primary osteoarthritis, right hip    Referring Provider  Dr. Thornton Park    Onset Date/Surgical Date  04/19/17    Prior Therapy  none      Precautions   Precautions  None      Restrictions   Weight Bearing Restrictions  No      Home Environment   Living Environment  Private residence      Prior Function   Level of Independence   Independent    Vocation  Full time employment    Vocation Requirements  driving    Leisure  yoga, workout with a trainer, massage therapist      Cognition   Overall Cognitive Status  Within Functional Limits for tasks assessed      Observation/Other Assessments   Focus on Therapeutic Outcomes (FOTO)   30% limitation      Posture/Postural Control   Posture/Postural Control  No significant limitations      PROM   Right Hip External Rotation   65    Right Hip Internal Rotation   20      Strength   Right Hip External Rotation   4/5    Right Hip Internal Rotation  4/5    Right Hip ABduction  4-/5                   [facility] Adult PT Treatment/Exercise - 10/16/17 0001      Knee/Hip Exercises: Standing   Other Standing Knee Exercises  stand on left leg wiht righ tfoot on slider with circles, push out, push backward  Other Standing Knee Exercises  plie 15 times; stand on left and bring right foot out  to side then behind right leg.              PT Education - 10/16/17 1656    Education Details  Access Code: [email or web address]     Person(s) Educated  Patient    Methods  Explanation;Demonstration;Verbal cues;Handout    Comprehension  Returned demonstration;Verbalized understanding       PT Short Term Goals - 09/10/17 1700      PT SHORT TERM GOAL #1   Title  independent with initial HEP    Time  4    Period  Weeks    Status  Achieved      PT SHORT TERM GOAL #2   Title  pain in right hip with driving for 1 hour decreased >/= 25% due to improve mobility    Time  4    Period  Weeks    Status  Achieved      PT SHORT TERM GOAL #3   Title  pain in right hip with walking up hills decreased >/= 25%    Time  4    Period  Weeks    Status  Achieved        PT Long Term Goals - 10/16/17 1657      PT LONG TERM GOAL #1   Title  independent with HEP and understand how to progress herself     Time  8    Period  Weeks    Status  Achieved      PT LONG TERM GOAL #2    Title  walking on incline in neighborhood with pain decreased >/= 75% due to improve right hip strength >/= 4/5    Time  8    Period  Weeks    Status  Achieved      PT LONG TERM GOAL #3   Title  crossing legs to put shoes on due ot right hip ER >/= 65 degrees    Time  8    Period  Weeks    Status  Achieved      PT LONG TERM GOAL #4   Title  driving for 1 hour with less pain decreased >/= 75%     Time  8    Period  Weeks    Status  Achieved      PT LONG TERM GOAL #5   Title  laying on right side for sleeping with pain decreased >/= 50%    Time  8    Period  Weeks    Status  Achieved            Plan - 10/16/17 1632    Clinical Impression Statement  Patient has met all of her goals.  Patient has less pain in right hip and understands how to manage it. Patient has increased strength of right hip with some weakness in the right hip abduction.  Patient has tight right hip flexors and difficulty extension. Patient is independent with her HEP and ready for discharge.     Rehab Potential  Excellent    Clinical Impairments Affecting Rehab Potential  none    PT Treatment/Interventions  Cryotherapy;Electrical Stimulation;Moist Heat;Ultrasound;Therapeutic exercise;Therapeutic activities;Neuromuscular re-education;Patient/family education;Manual techniques;Passive range of motion;Dry needling;Iontophoresis 53m/ml Dexamethasone    PT Next Visit Plan  Discharge to current HEP    PT Home Exercise Plan  Access Code: [email or web address]     [email or web address] Agree with Plan  of Care  Patient       Patient will benefit from skilled therapeutic intervention in order to improve the following deficits and impairments:  Pain, Increased fascial restricitons, Increased muscle spasms, Decreased activity tolerance, Decreased endurance, Decreased range of motion, Decreased strength  Visit Diagnosis: Pain in right hip  Stiffness of right hip, not elsewhere classified  Muscle weakness (generalized)     Problem  List Patient Active Problem List   Diagnosis Date Noted  . Cardiac conduction disorder 09/14/2014  . Endometriosis 09/14/2014  . Essential (primary) hypertension 09/14/2014  . Cardiac murmur 09/14/2014  . Adult hypothyroidism 09/14/2014  . Headache, migraine 09/14/2014  . Leiomyoma of uterus 09/14/2014    Earlie Counts, PT 10/16/17 5:03 PM   [facility] [address], [place], [place], [address] Phone: [phone]   Fax:  [phone]  Name: Marissa Mccarthy MRN: [id number] Date of Birth: [birth year] PHYSICAL THERAPY DISCHARGE SUMMARY  Visits from Start of Care: 11  Current functional level related to goals / functional outcomes: See above.    Remaining deficits: See above.    Education / Equipment: HEP  Plan: Patient agrees to discharge.  Patient goals were met. Patient is being discharged due to meeting the stated rehab goals.  Thank you for the referral. Earlie Counts, PT 10/16/17 5:03 PM  ?????

## 2017-10-16 NOTE — Patient Instructions (Signed)
Access Code: [email or web address]  URL: [email or web address]  Date: 10/16/2017  Prepared by: Earlie Counts   Exercises  Supine Quadriceps Stretch with Strap on Table - 2 reps - 1 sets - 30 hold - 1x daily - 7x weekly  Supine Figure 4 Piriformis Stretch - 2 reps - 1 sets - 30 hold - 1x daily - 7x weekly  Supine Piriformis Stretch - 2 reps - 1 sets - 30 sec hold - 1x daily - 7x weekly  Supine Single Knee to Chest Stretch - 2 reps - 1 sets - 30 sec hold - 1x daily - 7x weekly  Clamshell with Resistance - 10 reps - 1 sets - 1x daily - 3x weekly  Bridge with Hip Abduction and Resistance - Ground Touches - 10 reps - 2 sets - 1x daily - 3x weekly  Sidelying Hip Circles - 10 reps - 1 sets - 1x daily - 3x weekly  Standing Hip Abduction on Slider - 10 reps - 1 sets - 1x daily - 3x weekly  Standing Caudal Hip Glide with Band Flexion - 10 reps - 1 sets - 10 sec hold - 1x daily - 1x weekly  Sumo Squat with Dumbbell - 10 reps - 1 sets - 1x daily - 1-3x weekly  Standing Hip Adduction - 10 reps - 1 sets - 1x daily - 3x weekly  Sidelying Hip Flexion - 10 reps - 1 sets - 1x daily - 3x weekly  [facility] [address], [place], [place] [address] Phone # [phone] Fax [phone]

## 2018-01-02 ENCOUNTER — Telehealth: Payer: Self-pay | Admitting: Family Medicine

## 2018-01-02 NOTE — Telephone Encounter (Signed)
Pt needing a refill on:  SYNTHROID 75 MCG tablet  Please fill at:   [place], [place] [phone] (Phone) [phone] (Fax)   Thanks, [facility]

## 2018-01-03 ENCOUNTER — Other Ambulatory Visit: Payer: Self-pay | Admitting: Family Medicine

## 2018-03-24 ENCOUNTER — Other Ambulatory Visit: Payer: Self-pay | Admitting: Family Medicine

## 2018-03-24 DIAGNOSIS — I10 Essential (primary) hypertension: Secondary | ICD-10-CM

## 2018-03-24 MED ORDER — TELMISARTAN 20 MG PO TABS: 20.0000 mg | ORAL_TABLET | Freq: Every day | ORAL | 3 refills | Status: DC

## 2018-03-24 MED ORDER — SYNTHROID 75 MCG PO TABS: 75.0000 ug | ORAL_TABLET | Freq: Every day | ORAL | 3 refills | Status: DC

## 2018-03-24 NOTE — Telephone Encounter (Signed)
Needs refill on  Snythroid 75 mcg  Telmisartan 20 mg  She now uses [facility] in [place]  [phone]  CB#  [phone]  Thanks [facility]

## 2019-04-01 ENCOUNTER — Other Ambulatory Visit: Payer: Self-pay | Admitting: Family Medicine

## 2019-04-01 DIAGNOSIS — I10 Essential (primary) hypertension: Secondary | ICD-10-CM

## 2019-04-01 NOTE — Telephone Encounter (Signed)
Patient is requesting medication refills, she has not been seen in office since 2019. Called to schedule patient for OV or virtual visit. LVMTCB.

## 2019-04-01 NOTE — Telephone Encounter (Signed)
[facility] faxed refill request for the following medications:  SYNTHROID 75 MCG tablet telmisartan (MICARDIS) 20 MG tablet   90 day supply Last Rx: 03/24/2018 LOV: 07/16/2017 Please advise. Thanks TNP

## 2019-04-02 NOTE — Telephone Encounter (Signed)
Patient called in and has been scheduled for a virtual visit. Patient is requesting medications be refilled before appointment as she is out of BP medication and has 2 pills left for synthroid. Please advise.

## 2019-04-03 ENCOUNTER — Other Ambulatory Visit: Payer: Self-pay | Admitting: Family Medicine

## 2019-04-03 DIAGNOSIS — E039 Hypothyroidism, unspecified: Secondary | ICD-10-CM

## 2019-04-03 DIAGNOSIS — I10 Essential (primary) hypertension: Secondary | ICD-10-CM

## 2019-04-03 MED ORDER — SYNTHROID 75 MCG PO TABS: 75.0000 ug | ORAL_TABLET | Freq: Every day | ORAL | 0 refills | Status: DC

## 2019-04-03 MED ORDER — TELMISARTAN 20 MG PO TABS: 20.0000 mg | ORAL_TABLET | Freq: Every day | ORAL | 0 refills | Status: DC

## 2019-04-03 MED ORDER — SYNTHROID 75 MCG PO TABS: 75.0000 ug | ORAL_TABLET | Freq: Every day | ORAL | 3 refills | Status: AC

## 2019-04-03 MED ORDER — TELMISARTAN 20 MG PO TABS: 20.0000 mg | ORAL_TABLET | Freq: Every day | ORAL | 3 refills | Status: AC

## 2019-04-03 NOTE — Telephone Encounter (Signed)
Rxs were sent to pharmacy

## 2019-04-03 NOTE — Addendum Note (Signed)
Addended by: Julieta Bellini on: 04/03/2019 08:40 AM   Modules accepted: Orders

## 2019-04-06 ENCOUNTER — Ambulatory Visit (INDEPENDENT_AMBULATORY_CARE_PROVIDER_SITE_OTHER): Payer: BLUE CROSS/BLUE SHIELD | Admitting: Family Medicine

## 2019-04-06 DIAGNOSIS — E039 Hypothyroidism, unspecified: Secondary | ICD-10-CM

## 2019-04-06 DIAGNOSIS — I10 Essential (primary) hypertension: Secondary | ICD-10-CM

## 2019-04-06 NOTE — Progress Notes (Signed)
       Patient: Marissa Mccarthy Female    DOB: 09/19/1955   64 y.o.   MRN: [id number] Visit Date: 04/06/2019  Today's Provider: Wilhemena Durie, MD   Chief Complaint  Patient presents with  . Follow-up   Subjective:    Virtual Visit via Telephone Note  I connected with Marissa Mccarthy on 04/06/19 at  9:00 AM EDT by telephone and verified that I am speaking with the correct person using two identifiers.  Location: Patient: Home Provider: Office   I discussed the limitations, risks, security and privacy concerns of performing an evaluation and management service by telephone and the availability of in person appointments. I also discussed with the patient that there may be a patient responsible charge related to this service. The patient expressed understanding and agreed to proceed.    HPI  64 year old female lives with her husband in [place] now.  She retired from her job at [employer] in December and has been doing better.  They live in an [place] and she feels isolated but emotionally she is feeling better.  She is scheduled to have her first Covid shot this week but was exposed 4 days ago so we talked about putting off the shot for a week.  Her blood pressures been good and she started exercising. No Known Allergies   Current Outpatient Medications:  .  meloxicam (MOBIC) 7.5 MG tablet, , Disp: , Rfl: 1 .  SYNTHROID 75 MCG tablet, Take 1 tablet (75 mcg total) by mouth daily before breakfast., Disp: 90 tablet, Rfl: 3 .  telmisartan (MICARDIS) 20 MG tablet, Take 1 tablet (20 mg total) by mouth daily., Disp: 90 tablet, Rfl: 3  Review of Systems  Constitutional: Negative for appetite change, chills, fatigue and fever.  Respiratory: Negative for chest tightness and shortness of breath.   Cardiovascular: Negative for chest pain and palpitations.  Gastrointestinal: Negative for abdominal pain, nausea and vomiting.  Musculoskeletal: Positive for  arthralgias.       Ongoing hip issues.  Allergic/Immunologic: Negative.   Neurological: Negative for dizziness and weakness.  Psychiatric/Behavioral: Negative.     Social History   Tobacco Use  . Smoking status: Never Smoker  . Smokeless tobacco: Never Used  Substance Use Topics  . Alcohol use: Yes    Comment: 1 drink daily      Objective:   There were no vitals taken for this visit. There were no vitals filed for this visit.There is no height or weight on file to calculate BMI.   Physical Exam   No results found for any visits on 04/06/19.     Assessment & Plan     1. Essential (primary) hypertension Controlled on telmisartan. Patient plans to get a primary care physician in [place] later on this spring.  2. Adult hypothyroidism On Synthroid. 3 recent Covid exposure Patient will put off vaccine for 1 week.  I discussed the assessment and treatment plan with the patient. The patient was provided an opportunity to ask questions and all were answered. The patient agreed with the plan and demonstrated an understanding of the instructions.   The patient was advised to call back or seek an in-person evaluation if the symptoms worsen or if the condition fails to improve as anticipated.  I provided 10 minutes of non-face-to-face time during this encounter.    Richard Cranford Mon, MD  [facility]
